# Patient Record
Sex: Male | Born: 1965 | Race: Black or African American | Hispanic: No | Marital: Married | Smoking: Current every day smoker
Health system: Southern US, Community
[De-identification: ages and names within clinical notes are randomized; demographics above are authoritative.]

## PROBLEM LIST (undated history)

## (undated) DIAGNOSIS — G510 Bell's palsy: Secondary | ICD-10-CM

## (undated) DIAGNOSIS — B019 Varicella without complication: Secondary | ICD-10-CM

## (undated) DIAGNOSIS — B009 Herpesviral infection, unspecified: Secondary | ICD-10-CM

## (undated) DIAGNOSIS — I10 Essential (primary) hypertension: Secondary | ICD-10-CM

## (undated) DIAGNOSIS — T7840XA Allergy, unspecified, initial encounter: Secondary | ICD-10-CM

## (undated) DIAGNOSIS — E559 Vitamin D deficiency, unspecified: Secondary | ICD-10-CM

## (undated) HISTORY — PX: DENTAL RESTORATION/EXTRACTION WITH X-RAY: SHX5796

## (undated) HISTORY — DX: Vitamin D deficiency, unspecified: E55.9

## (undated) HISTORY — DX: Varicella without complication: B01.9

## (undated) HISTORY — DX: Allergy, unspecified, initial encounter: T78.40XA

## (undated) HISTORY — PX: COLONOSCOPY: SHX174

## (undated) HISTORY — DX: Herpesviral infection, unspecified: B00.9

## (undated) HISTORY — PX: HERNIA REPAIR: SHX51

---

## 2007-01-26 ENCOUNTER — Emergency Department: Payer: Self-pay | Admitting: Emergency Medicine

## 2010-01-25 ENCOUNTER — Emergency Department (HOSPITAL_COMMUNITY): Admission: EM | Admit: 2010-01-25 | Discharge: 2010-01-25 | Payer: Self-pay | Admitting: Emergency Medicine

## 2011-02-23 ENCOUNTER — Emergency Department: Payer: Self-pay | Admitting: Emergency Medicine

## 2011-03-03 ENCOUNTER — Emergency Department: Payer: Self-pay | Admitting: *Deleted

## 2013-01-15 ENCOUNTER — Emergency Department: Payer: Self-pay | Admitting: Emergency Medicine

## 2014-04-28 ENCOUNTER — Emergency Department: Payer: Self-pay | Admitting: Internal Medicine

## 2016-01-10 ENCOUNTER — Encounter (HOSPITAL_BASED_OUTPATIENT_CLINIC_OR_DEPARTMENT_OTHER): Payer: Self-pay | Admitting: *Deleted

## 2016-01-10 ENCOUNTER — Emergency Department (HOSPITAL_BASED_OUTPATIENT_CLINIC_OR_DEPARTMENT_OTHER)
Admission: EM | Admit: 2016-01-10 | Discharge: 2016-01-10 | Disposition: A | Discharge status: Home / Self Care | Payer: BLUE CROSS/BLUE SHIELD | Attending: Emergency Medicine | Admitting: Emergency Medicine

## 2016-01-10 ENCOUNTER — Emergency Department (HOSPITAL_BASED_OUTPATIENT_CLINIC_OR_DEPARTMENT_OTHER): Payer: BLUE CROSS/BLUE SHIELD

## 2016-01-10 DIAGNOSIS — Y939 Activity, unspecified: Secondary | ICD-10-CM | POA: Diagnosis not present

## 2016-01-10 DIAGNOSIS — Y999 Unspecified external cause status: Secondary | ICD-10-CM | POA: Diagnosis not present

## 2016-01-10 DIAGNOSIS — S60562A Insect bite (nonvenomous) of left hand, initial encounter: Secondary | ICD-10-CM | POA: Diagnosis present

## 2016-01-10 DIAGNOSIS — F17210 Nicotine dependence, cigarettes, uncomplicated: Secondary | ICD-10-CM | POA: Diagnosis not present

## 2016-01-10 DIAGNOSIS — L02512 Cutaneous abscess of left hand: Secondary | ICD-10-CM | POA: Diagnosis not present

## 2016-01-10 DIAGNOSIS — W57XXXA Bitten or stung by nonvenomous insect and other nonvenomous arthropods, initial encounter: Secondary | ICD-10-CM | POA: Insufficient documentation

## 2016-01-10 DIAGNOSIS — L03114 Cellulitis of left upper limb: Secondary | ICD-10-CM | POA: Diagnosis not present

## 2016-01-10 DIAGNOSIS — Y929 Unspecified place or not applicable: Secondary | ICD-10-CM | POA: Diagnosis not present

## 2016-01-10 DIAGNOSIS — L02519 Cutaneous abscess of unspecified hand: Secondary | ICD-10-CM

## 2016-01-10 DIAGNOSIS — Z792 Long term (current) use of antibiotics: Secondary | ICD-10-CM | POA: Diagnosis not present

## 2016-01-10 LAB — COMPREHENSIVE METABOLIC PANEL
ALK PHOS: 94 U/L (ref 38–126)
ALT: 13 U/L — AB (ref 17–63)
AST: 17 U/L (ref 15–41)
Albumin: 3.9 g/dL (ref 3.5–5.0)
Anion gap: 9 (ref 5–15)
BUN: 13 mg/dL (ref 6–20)
CALCIUM: 9.8 mg/dL (ref 8.9–10.3)
CO2: 26 mmol/L (ref 22–32)
CREATININE: 1.16 mg/dL (ref 0.61–1.24)
Chloride: 104 mmol/L (ref 101–111)
Glucose, Bld: 85 mg/dL (ref 65–99)
Potassium: 3.6 mmol/L (ref 3.5–5.1)
SODIUM: 139 mmol/L (ref 135–145)
Total Bilirubin: 0.6 mg/dL (ref 0.3–1.2)
Total Protein: 8.1 g/dL (ref 6.5–8.1)

## 2016-01-10 LAB — CBC WITH DIFFERENTIAL/PLATELET
BASOS PCT: 0 %
Basophils Absolute: 0.1 10*3/uL (ref 0.0–0.1)
EOS ABS: 0.6 10*3/uL (ref 0.0–0.7)
EOS PCT: 4 %
HCT: 42.6 % (ref 39.0–52.0)
HEMOGLOBIN: 14.7 g/dL (ref 13.0–17.0)
Lymphocytes Relative: 21 %
Lymphs Abs: 2.7 10*3/uL (ref 0.7–4.0)
MCH: 31.5 pg (ref 26.0–34.0)
MCHC: 34.5 g/dL (ref 30.0–36.0)
MCV: 91.4 fL (ref 78.0–100.0)
MONOS PCT: 10 %
Monocytes Absolute: 1.3 10*3/uL — ABNORMAL HIGH (ref 0.1–1.0)
NEUTROS PCT: 65 %
Neutro Abs: 8.7 10*3/uL — ABNORMAL HIGH (ref 1.7–7.7)
Platelets: 234 10*3/uL (ref 150–400)
RBC: 4.66 MIL/uL (ref 4.22–5.81)
RDW: 13.1 % (ref 11.5–15.5)
WBC: 13.4 10*3/uL — ABNORMAL HIGH (ref 4.0–10.5)

## 2016-01-10 LAB — I-STAT CG4 LACTIC ACID, ED: Lactic Acid, Venous: 1.2 mmol/L (ref 0.5–1.9)

## 2016-01-10 MED ORDER — VANCOMYCIN HCL IN DEXTROSE 1-5 GM/200ML-% IV SOLN
1000.0000 mg | Freq: Once | INTRAVENOUS | Status: AC
  Administered 2016-01-10: 1000 mg via INTRAVENOUS
  Filled 2016-01-10: qty 200

## 2016-01-10 MED ORDER — DIPHENHYDRAMINE HCL 50 MG/ML IJ SOLN
25.0000 mg | Freq: Once | INTRAMUSCULAR | Status: AC
  Administered 2016-01-10: 25 mg via INTRAVENOUS
  Filled 2016-01-10: qty 1

## 2016-01-10 MED ORDER — DEXTROSE 5 % IV SOLN
1.0000 g | Freq: Once | INTRAVENOUS | Status: AC
  Administered 2016-01-10: 1 g via INTRAVENOUS
  Filled 2016-01-10: qty 10

## 2016-01-10 MED ORDER — LIDOCAINE HCL (PF) 1 % IJ SOLN
5.0000 mL | Freq: Once | INTRAMUSCULAR | Status: AC
  Administered 2016-01-10: 5 mL
  Filled 2016-01-10: qty 5

## 2016-01-10 MED ORDER — OXYCODONE-ACETAMINOPHEN 5-325 MG PO TABS
2.0000 | ORAL_TABLET | Freq: Once | ORAL | Status: AC
  Administered 2016-01-10: 2 via ORAL
  Filled 2016-01-10: qty 2

## 2016-01-10 NOTE — ED Provider Notes (Signed)
CSN: [id number]     Arrival date & time 01/10/16  1758 History  By signing my name below, I, Soijett Blue, attest that this documentation has been prepared under the direction and in the presence of Charlesetta Shanks, MD. Electronically Signed: [place], ED Scribe. 01/10/2016. 7:03 PM.   Chief Complaint  Patient presents with  . Insect Bite      The history is provided by the patient. No language interpreter was used.    Walter Huffman is a 50 y.o. male who presents to the Emergency Department complaining of an insect bite onset 6 days ago. Pt notes that he was bit by a spider to his left hand with two small holes to the area initially. Pt states that he was seen at Urgent Care for his symptoms 4 days ago and was Rx bactrim and informed to apply warm compresses to the area. Pt notes that after applying warm compresses, the wound opened and began to drain. Pt states that his left hand/wrist pain resolved following. Pt is having associated symptoms of fever of 101 last night, resolved chills x 2 days ago, and left hand swelling. Pt states that his left hand swelling has mildly improved since being treated with antibiotics. Pt reports that he has tried Rx septra and warm compresses for the relief of his symptoms. Pt denies left wrist pain, left hand pain, and any other symptoms. Denies allergies to medications.    Past Medical History  Diagnosis Date  . Bell's palsy    Past Surgical History  Procedure Laterality Date  . Colonoscopy    . Hernia repair    . Dental restoration/extraction with x-ray      had teeth removed  . I&d extremity Left 01/11/2016    Procedure: IRRIGATION AND DEBRIDEMENT OF WRIST;  Surgeon: Iran Planas, MD;  Location: [facility];  Service: Orthopedics;  Laterality: Left;   History reviewed. No pertinent family history. Social History  Substance Use Topics  . Smoking status: Current Every Day Smoker -- 1.00 packs/day for 20 years    Types: Cigarettes  . Smokeless  tobacco: None  . Alcohol Use: No    Review of Systems  Constitutional: Positive for fever and chills.  Musculoskeletal: Positive for joint swelling (left hand). Negative for arthralgias.  10 Systems reviewed and are negative for acute change except as noted in the HPI.    Allergies  Review of patient's allergies indicates no known allergies.  Home Medications   Prior to Admission medications   Medication Sig Start Date End Date Taking? Authorizing Provider  sulfamethoxazole-trimethoprim (BACTRIM DS,SEPTRA DS) 800-160 MG tablet Take 1 tablet by mouth 2 (two) times daily.   Yes Historical Provider, MD  docusate sodium (COLACE) 100 MG capsule Take 1 capsule (100 mg total) by mouth 2 (two) times daily. 01/11/16   Iran Planas, MD  doxycycline (VIBRA-TABS) 100 MG tablet Take 1 tablet (100 mg total) by mouth 2 (two) times daily. 01/11/16   Iran Planas, MD  naproxen sodium (ANAPROX) 220 MG tablet Take 660 mg by mouth daily as needed (general pain).    Historical Provider, MD  oxyCODONE-acetaminophen (ROXICET) 5-325 MG tablet Take 1 tablet by mouth every 4 (four) hours as needed for severe pain. 01/11/16   Iran Planas, MD   BP 124/90 mmHg  Pulse 54  Temp(Src) 98.3 F (36.8 C) (Oral)  Resp 18  Ht 5\' 8"  (1.727 m)  Wt 135 lb (61.236 kg)  BMI 20.53 kg/m2  SpO2 98% Physical  Exam  Constitutional: He is oriented to person, place, and time. He appears well-developed and well-nourished. No distress.  HENT:  Head: Normocephalic and atraumatic.  Eyes: EOM are normal.  Neck: Neck supple.  Cardiovascular: Normal rate, regular rhythm and normal heart sounds.  Exam reveals no gallop and no friction rub.   No murmur heard. Pulmonary/Chest: Effort normal and breath sounds normal. No respiratory distress. He has no wheezes. He has no rales.  Abdominal: Soft. He exhibits no distension. There is no tenderness.  Musculoskeletal: Normal range of motion. He exhibits edema and tenderness.  No LE  edema. Patient left hand has moderate diffuse swelling over the dorsum of the hand that extends halfway up the forearm. This area is erythematous. Localized on the ulnar aspect of the wrist there is an approximately 3 cm raised abscess area with a moist eschar on top. This area is more erythematous and swollen. Patient is able to flex and extend at the wrist although with some tenderness. She can open and close the hand with range of motion intact for all fingers. No pain or swelling to the level of the elbow.  Neurological: He is alert and oriented to person, place, and time.  Skin: Skin is warm and dry.  Psychiatric: He has a normal mood and affect. His behavior is normal.  Nursing note and vitals reviewed.   ED Course  Procedures (including critical care time) DIAGNOSTIC STUDIES: Oxygen Saturation is 100% on RA, nl by my interpretation.    COORDINATION OF CARE: 7:01 PM Discussed treatment plan with pt at bedside which includes IV abx, labs, I&D, and follow up with orthopedist, and pt agreed to plan.  INCISION AND DRAINAGE PROCEDURE NOTE: Patient identification was confirmed and verbal consent was obtained. This procedure was performed by Charlesetta Shanks, MD at 10:22 PM. Site: left wrist Sterile procedures observed: Yes Needle size: 23 gauge  Anesthetic used (type and amt): 1% lidocaine without epinephrine and 2 ml used Blade size: 11 Drainage: scant purulent Complexity: Complex Packing used: 1/2 inch iodoform Site anesthetized, incision made over site, wound drained and explored loculations, rinsed with copious amounts of normal saline, wound packed with sterile gauze, covered with dry, sterile dressing.  Pt tolerated procedure well without complications.  Instructions for care discussed verbally and pt provided with additional written instructions for homecare and f/u.   Labs Review Labs Reviewed  COMPREHENSIVE METABOLIC PANEL - Abnormal; Notable for the following:    ALT 13  (*)    All other components within normal limits  CBC WITH DIFFERENTIAL/PLATELET - Abnormal; Notable for the following:    WBC 13.4 (*)    Neutro Abs 8.7 (*)    Monocytes Absolute 1.3 (*)    All other components within normal limits  CULTURE, BLOOD (ROUTINE X 2)  CULTURE, BLOOD (ROUTINE X 2)  AEROBIC CULTURE (SUPERFICIAL SPECIMEN)  I-STAT CG4 LACTIC ACID, ED    Imaging Review Dg Hand Complete Left  01/10/2016  CLINICAL DATA:  Status post spider bite of the left hand 5 days ago with infected wound at the base of the fifth metacarpal EXAM: LEFT HAND - COMPLETE 3+ VIEW COMPARISON:  None. FINDINGS: There is no evidence of fracture or dislocation. There is no evidence of arthropathy or other focal bone abnormality. There is soft tissue swelling near the base of the fifth metacarpal. There is no osteomyelitis. IMPRESSION: No bony abnormality identified. Soft tissue swelling near the base of fifth metacarpal. Electronically Signed   By: Abelardo Diesel  M.D.   On: 01/10/2016 20:44    I have personally reviewed and evaluated these images and lab results as part of my medical decision-making.  Consult: Case reviewed with Dr. Apolonio Schneiders. At this time, he suggests I can proceed with a larger incision to the abscess and administration of IV antibiotics. Patient will be able to be seen in his office the next morning to be evaluated for either ongoing management with antibiotics or surgical intervention if needed.  MDM   Final diagnoses:  Hand abscess  Cellulitis of left upper extremity   Patient presents with a large abscess to the lateral left wrist. He suspects this was initiated by a spider bite. He had already developed some spontaneous drainage. Wider incision and drainage with packing is performed. Patient was treated with Rocephin and vancomycin in the emergency department. He is alert and nontoxic. No elevation and lactic acid. No fever. Vital signs stable. At this time, I feel he is safe to go home  and follow-up with Dr. Apolonio Schneiders. This will be less than 24 hours from his antibiotic administration. Signs and symptoms for which return are reviewed.   Charlesetta Shanks, MD 01/13/16 1039

## 2016-01-10 NOTE — Discharge Instructions (Signed)
Abscess You have had Rocephin and vancomycin in the emergency department tonight. Your abscess has been opened and packed. You are to follow-up with the hand surgeon, Dr. Apolonio Schneiders tomorrow morning in his office at 8 AM. Do not eat or drink anything after midnight tonight. You may have surgery tomorrow if Dr. Apolonio Schneiders finds that you need a larger incision and drainage of your abscess. An abscess is an infected area that contains a collection of pus and debris.It can occur in almost any part of the body. An abscess is also known as a furuncle or boil. CAUSES  An abscess occurs when tissue gets infected. This can occur from blockage of oil or sweat glands, infection of hair follicles, or a minor injury to the skin. As the body tries to fight the infection, pus collects in the area and creates pressure under the skin. This pressure causes pain. People with weakened immune systems have difficulty fighting infections and get certain abscesses more often.  SYMPTOMS Usually an abscess develops on the skin and becomes a painful mass that is red, warm, and tender. If the abscess forms under the skin, you may feel a moveable soft area under the skin. Some abscesses break open (rupture) on their own, but most will continue to get worse without care. The infection can spread deeper into the body and eventually into the bloodstream, causing you to feel ill.  DIAGNOSIS  Your caregiver will take your medical history and perform a physical exam. A sample of fluid may also be taken from the abscess to determine what is causing your infection. TREATMENT  Your caregiver may prescribe antibiotic medicines to fight the infection. However, taking antibiotics alone usually does not cure an abscess. Your caregiver may need to make a small cut (incision) in the abscess to drain the pus. In some cases, gauze is packed into the abscess to reduce pain and to continue draining the area. HOME CARE INSTRUCTIONS   Only take  over-the-counter or prescription medicines for pain, discomfort, or fever as directed by your caregiver.  If you were prescribed antibiotics, take them as directed. Finish them even if you start to feel better.  If gauze is used, follow your caregiver's directions for changing the gauze.  To avoid spreading the infection:  Keep your draining abscess covered with a bandage.  Wash your hands well.  Do not share personal care items, towels, or whirlpools with others.  Avoid skin contact with others.  Keep your skin and clothes clean around the abscess.  Keep all follow-up appointments as directed by your caregiver. SEEK MEDICAL CARE IF:   You have increased pain, swelling, redness, fluid drainage, or bleeding.  You have muscle aches, chills, or a general ill feeling.  You have a fever. MAKE SURE YOU:   Understand these instructions.  Will watch your condition.  Will get help right away if you are not doing well or get worse.   This information is not intended to replace advice given to you by your health care provider. Make sure you discuss any questions you have with your health care provider.   Document Released: 04/10/2005 Document Revised: 12/31/2011 Document Reviewed: 09/13/2011 Elsevier Interactive Patient Education [employer].

## 2016-01-10 NOTE — ED Notes (Signed)
Pt c/o insect bite to left hand x 6 days ago , seen by UC x 4 days ago , taking septra, w/o improvement

## 2016-01-11 ENCOUNTER — Encounter (HOSPITAL_COMMUNITY): Payer: Self-pay | Admitting: *Deleted

## 2016-01-11 ENCOUNTER — Ambulatory Visit (HOSPITAL_COMMUNITY): Payer: BLUE CROSS/BLUE SHIELD | Admitting: Anesthesiology

## 2016-01-11 ENCOUNTER — Ambulatory Visit (HOSPITAL_COMMUNITY)
Admission: AD | Admit: 2016-01-11 | Discharge: 2016-01-11 | Disposition: A | Discharge status: Home / Self Care | Payer: BLUE CROSS/BLUE SHIELD | Source: Ambulatory Visit | Attending: Orthopedic Surgery | Admitting: Orthopedic Surgery

## 2016-01-11 ENCOUNTER — Encounter (HOSPITAL_COMMUNITY)
Admission: AD | Disposition: A | Discharge status: Home / Self Care | Payer: Self-pay | Source: Ambulatory Visit | Attending: Orthopedic Surgery

## 2016-01-11 DIAGNOSIS — T63301A Toxic effect of unspecified spider venom, accidental (unintentional), initial encounter: Secondary | ICD-10-CM | POA: Insufficient documentation

## 2016-01-11 DIAGNOSIS — L02519 Cutaneous abscess of unspecified hand: Secondary | ICD-10-CM | POA: Insufficient documentation

## 2016-01-11 DIAGNOSIS — F17210 Nicotine dependence, cigarettes, uncomplicated: Secondary | ICD-10-CM | POA: Insufficient documentation

## 2016-01-11 HISTORY — DX: Bell's palsy: G51.0

## 2016-01-11 HISTORY — PX: I&D EXTREMITY: SHX5045

## 2016-01-11 SURGERY — IRRIGATION AND DEBRIDEMENT EXTREMITY
Anesthesia: General | Site: Wrist | Laterality: Left

## 2016-01-11 MED ORDER — ONDANSETRON HCL 4 MG/2ML IJ SOLN
INTRAMUSCULAR | Status: DC | PRN
  Administered 2016-01-11: 4 mg via INTRAVENOUS

## 2016-01-11 MED ORDER — PROMETHAZINE HCL 25 MG/ML IJ SOLN
6.2500 mg | INTRAMUSCULAR | Status: DC | PRN
Start: 2016-01-11 — End: 2016-01-11

## 2016-01-11 MED ORDER — PROPOFOL 10 MG/ML IV BOLUS
INTRAVENOUS | Status: DC | PRN
  Administered 2016-01-11: 160 mg via INTRAVENOUS
  Administered 2016-01-11: 20 mg via INTRAVENOUS

## 2016-01-11 MED ORDER — HYDROMORPHONE HCL 1 MG/ML IJ SOLN: 0.2500 mg | INTRAMUSCULAR | Status: DC | PRN

## 2016-01-11 MED ORDER — DOCUSATE SODIUM 100 MG PO CAPS: 100.0000 mg | ORAL_CAPSULE | Freq: Two times a day (BID) | ORAL | Status: DC

## 2016-01-11 MED ORDER — DOXYCYCLINE HYCLATE 100 MG PO TABS: 100.0000 mg | ORAL_TABLET | Freq: Two times a day (BID) | ORAL | Status: DC

## 2016-01-11 MED ORDER — MEPERIDINE HCL 25 MG/ML IJ SOLN: 6.2500 mg | INTRAMUSCULAR | Status: DC | PRN

## 2016-01-11 MED ORDER — FENTANYL CITRATE (PF) 250 MCG/5ML IJ SOLN
INTRAMUSCULAR | Status: AC
  Filled 2016-01-11: qty 5

## 2016-01-11 MED ORDER — MIDAZOLAM HCL 5 MG/5ML IJ SOLN
INTRAMUSCULAR | Status: DC | PRN
  Administered 2016-01-11: 2 mg via INTRAVENOUS

## 2016-01-11 MED ORDER — LIDOCAINE HCL (CARDIAC) 10 MG/ML IV SOLN
INTRAVENOUS | Status: DC | PRN
  Administered 2016-01-11: 100 mg via INTRAVENOUS

## 2016-01-11 MED ORDER — OXYCODONE-ACETAMINOPHEN 5-325 MG PO TABS: 1.0000 | ORAL_TABLET | ORAL | Status: DC | PRN

## 2016-01-11 MED ORDER — MIDAZOLAM HCL 2 MG/2ML IJ SOLN
INTRAMUSCULAR | Status: AC
  Filled 2016-01-11: qty 2

## 2016-01-11 MED ORDER — SODIUM CHLORIDE 0.9 % IR SOLN
Status: DC | PRN
  Administered 2016-01-11: 3000 mL

## 2016-01-11 MED ORDER — LACTATED RINGERS IV SOLN
INTRAVENOUS | Status: DC
  Administered 2016-01-11: 14:00:00 via INTRAVENOUS

## 2016-01-11 MED ORDER — FENTANYL CITRATE (PF) 100 MCG/2ML IJ SOLN
INTRAMUSCULAR | Status: DC | PRN
  Administered 2016-01-11: 25 ug via INTRAVENOUS
  Administered 2016-01-11: 50 ug via INTRAVENOUS
  Administered 2016-01-11: 25 ug via INTRAVENOUS

## 2016-01-11 MED ORDER — BUPIVACAINE HCL (PF) 0.25 % IJ SOLN
INTRAMUSCULAR | Status: AC
  Filled 2016-01-11: qty 30

## 2016-01-11 SURGICAL SUPPLY — 60 items
BANDAGE ACE 3X5.8 VEL STRL LF (GAUZE/BANDAGES/DRESSINGS) ×3 IMPLANT
BANDAGE ELASTIC 3 VELCRO ST LF (GAUZE/BANDAGES/DRESSINGS) ×6 IMPLANT
BANDAGE ELASTIC 4 VELCRO ST LF (GAUZE/BANDAGES/DRESSINGS) ×3 IMPLANT
BNDG COHESIVE 1X5 TAN STRL LF (GAUZE/BANDAGES/DRESSINGS) IMPLANT
BNDG CONFORM 2 STRL LF (GAUZE/BANDAGES/DRESSINGS) IMPLANT
BNDG ESMARK 4X9 LF (GAUZE/BANDAGES/DRESSINGS) ×3 IMPLANT
BNDG GAUZE ELAST 4 BULKY (GAUZE/BANDAGES/DRESSINGS) ×3 IMPLANT
CORDS BIPOLAR (ELECTRODE) ×3 IMPLANT
COVER SURGICAL LIGHT HANDLE (MISCELLANEOUS) ×3 IMPLANT
CUFF TOURNIQUET SINGLE 18IN (TOURNIQUET CUFF) ×3 IMPLANT
CUFF TOURNIQUET SINGLE 24IN (TOURNIQUET CUFF) IMPLANT
DRAIN PENROSE 1/4X12 LTX STRL (WOUND CARE) IMPLANT
DRAPE SURG 17X23 STRL (DRAPES) ×3 IMPLANT
DRSG ADAPTIC 3X8 NADH LF (GAUZE/BANDAGES/DRESSINGS) ×3 IMPLANT
ELECT REM PT RETURN 9FT ADLT (ELECTROSURGICAL)
ELECTRODE REM PT RTRN 9FT ADLT (ELECTROSURGICAL) IMPLANT
GAUZE PACKING IODOFORM 1/4X15 (GAUZE/BANDAGES/DRESSINGS) ×3 IMPLANT
GAUZE SPONGE 4X4 12PLY STRL (GAUZE/BANDAGES/DRESSINGS) ×6 IMPLANT
GAUZE XEROFORM 1X8 LF (GAUZE/BANDAGES/DRESSINGS) ×3 IMPLANT
GAUZE XEROFORM 5X9 LF (GAUZE/BANDAGES/DRESSINGS) IMPLANT
GLOVE BIOGEL PI IND STRL 8.5 (GLOVE) ×1 IMPLANT
GLOVE BIOGEL PI INDICATOR 8.5 (GLOVE) ×2
GLOVE SURG ORTHO 8.0 STRL STRW (GLOVE) ×3 IMPLANT
GOWN STRL REUS W/ TWL LRG LVL3 (GOWN DISPOSABLE) ×3 IMPLANT
GOWN STRL REUS W/ TWL XL LVL3 (GOWN DISPOSABLE) ×1 IMPLANT
GOWN STRL REUS W/TWL LRG LVL3 (GOWN DISPOSABLE) ×6
GOWN STRL REUS W/TWL XL LVL3 (GOWN DISPOSABLE) ×2
HANDPIECE INTERPULSE COAX TIP (DISPOSABLE)
KIT BASIN OR (CUSTOM PROCEDURE TRAY) ×3 IMPLANT
KIT ROOM TURNOVER OR (KITS) ×3 IMPLANT
MANIFOLD NEPTUNE II (INSTRUMENTS) ×3 IMPLANT
NEEDLE HYPO 25GX1X1/2 BEV (NEEDLE) IMPLANT
NS IRRIG 1000ML POUR BTL (IV SOLUTION) ×3 IMPLANT
PACK ORTHO EXTREMITY (CUSTOM PROCEDURE TRAY) ×3 IMPLANT
PAD ARMBOARD 7.5X6 YLW CONV (MISCELLANEOUS) ×6 IMPLANT
PAD CAST 4YDX4 CTTN HI CHSV (CAST SUPPLIES) ×1 IMPLANT
PADDING CAST COTTON 4X4 STRL (CAST SUPPLIES) ×2
SET HNDPC FAN SPRY TIP SCT (DISPOSABLE) IMPLANT
SOAP 2 % CHG 4 OZ (WOUND CARE) ×3 IMPLANT
SPLINT FIBERGLASS 3X12 (CAST SUPPLIES) ×3 IMPLANT
SPONGE GAUZE 4X4 12PLY STER LF (GAUZE/BANDAGES/DRESSINGS) ×3 IMPLANT
SPONGE LAP 18X18 X RAY DECT (DISPOSABLE) ×3 IMPLANT
SPONGE LAP 4X18 X RAY DECT (DISPOSABLE) ×3 IMPLANT
SUCTION FRAZIER HANDLE 10FR (MISCELLANEOUS) ×2
SUCTION TUBE FRAZIER 10FR DISP (MISCELLANEOUS) ×1 IMPLANT
SUT ETHILON 4 0 PS 2 18 (SUTURE) IMPLANT
SUT ETHILON 5 0 P 3 18 (SUTURE)
SUT NYLON ETHILON 5-0 P-3 1X18 (SUTURE) IMPLANT
SUT PROLENE 3 0 PS 2 (SUTURE) ×3 IMPLANT
SWAB COLLECTION DEVICE MRSA (MISCELLANEOUS) ×3 IMPLANT
SWAB CULTURE ESWAB REG 1ML (MISCELLANEOUS) ×3 IMPLANT
SYR CONTROL 10ML LL (SYRINGE) IMPLANT
TOWEL OR 17X24 6PK STRL BLUE (TOWEL DISPOSABLE) ×3 IMPLANT
TOWEL OR 17X26 10 PK STRL BLUE (TOWEL DISPOSABLE) ×3 IMPLANT
TUBE ANAEROBIC SPECIMEN COL (MISCELLANEOUS) IMPLANT
TUBE CONNECTING 12'X1/4 (SUCTIONS) ×1
TUBE CONNECTING 12X1/4 (SUCTIONS) ×2 IMPLANT
UNDERPAD 30X30 INCONTINENT (UNDERPADS AND DIAPERS) ×3 IMPLANT
WATER STERILE IRR 1000ML POUR (IV SOLUTION) ×3 IMPLANT
YANKAUER SUCT BULB TIP NO VENT (SUCTIONS) ×3 IMPLANT

## 2016-01-11 NOTE — Discharge Instructions (Signed)
KEEP BANDAGE CLEAN AND DRY CALL OFFICE FOR F/U APPT [phone] on Monday 01/15/2016 ALSO CALL FOR THERAPY APPT ON 01/15/2016 [id number] EXT 41 KEEP HAND ELEVATED ABOVE HEART OK TO APPLY ICE TO OPERATIVE AREA CONTACT OFFICE IF ANY WORSENING PAIN OR CONCERNS.

## 2016-01-11 NOTE — Transfer of Care (Signed)
Immediate Anesthesia Transfer of Care Note  Patient: Walter Huffman  Procedure(s) Performed: Procedure(s): IRRIGATION AND DEBRIDEMENT OF WRIST (Left)  Patient Location: PACU  Anesthesia Type:General  Level of Consciousness: sedated  Airway & Oxygen Therapy: Patient Spontanous Breathing and Patient connected to nasal cannula oxygen  Post-op Assessment: Report given to RN and Post -op Vital signs reviewed and stable  Post vital signs: Reviewed and stable  Last Vitals:  Filed Vitals:   01/11/16 1341 01/11/16 1632  BP: 163/94 149/98  Pulse: 54 70  Temp: 36.7 C 36.5 C  Resp: 20 11    Last Pain: There were no vitals filed for this visit.    Patients Stated Pain Goal: 2 ([id number])  Complications: No apparent anesthesia complications

## 2016-01-11 NOTE — Anesthesia Postprocedure Evaluation (Signed)
Anesthesia Post Note  Patient: Walter Huffman  Procedure(s) Performed: Procedure(s) (LRB): IRRIGATION AND DEBRIDEMENT OF WRIST (Left)  Patient location during evaluation: PACU Anesthesia Type: General Level of consciousness: awake and alert Pain management: pain level controlled Vital Signs Assessment: post-procedure vital signs reviewed and stable Respiratory status: spontaneous breathing, nonlabored ventilation, respiratory function stable and patient connected to nasal cannula oxygen Cardiovascular status: blood pressure returned to baseline and stable Postop Assessment: no signs of nausea or vomiting Anesthetic complications: no    Last Vitals:  Filed Vitals:   01/11/16 1647 01/11/16 1702  BP: 144/94 128/80  Pulse: 60 58  Temp:    Resp: 16 18    Last Pain: There were no vitals filed for this visit.               [place]

## 2016-01-11 NOTE — H&P (Signed)
Walter Huffman is an 50 y.o. male.   Chief Complaint: left wrist pain and swelling HPI: Pt seen/evaluated in office today Pt with abscess over dorsal ulnar aspect of wrist Pt here for surgery on left wrist No prior surgery to left wrist Pt not sure if bitten by anything,reason for infection  No past medical history on file.  No past surgical history on file.  No family history on file. Social History:  reports that he has been smoking Cigarettes.  He has been smoking about 1.00 pack per day. He does not have any smokeless tobacco history on file. He reports that he does not drink alcohol or use illicit drugs.  Allergies: No Known Allergies  No prescriptions prior to admission    Results for orders placed or performed during the hospital encounter of 01/10/16 (from the past 48 hour(s))  Comprehensive metabolic panel     Status: Abnormal   Collection Time: 01/10/16  7:30 PM  Result Value Ref Range   Sodium 139 135 - 145 mmol/L   Potassium 3.6 3.5 - 5.1 mmol/L   Chloride 104 101 - 111 mmol/L   CO2 26 22 - 32 mmol/L   Glucose, Bld 85 65 - 99 mg/dL   BUN 13 6 - 20 mg/dL   Creatinine, Ser 1.16 0.61 - 1.24 mg/dL   Calcium 9.8 8.9 - 10.3 mg/dL   Total Protein 8.1 6.5 - 8.1 g/dL   Albumin 3.9 3.5 - 5.0 g/dL   AST 17 15 - 41 U/L   ALT 13 (L) 17 - 63 U/L   Alkaline Phosphatase 94 38 - 126 U/L   Total Bilirubin 0.6 0.3 - 1.2 mg/dL   GFR calc non Af Amer >60 >60 mL/min   GFR calc Af Amer >60 >60 mL/min    Comment: (NOTE) The eGFR has been calculated using the CKD EPI equation. This calculation has not been validated in all clinical situations. eGFR's persistently <60 mL/min signify possible Chronic Kidney Disease.    Anion gap 9 5 - 15  CBC with Differential     Status: Abnormal   Collection Time: 01/10/16  7:30 PM  Result Value Ref Range   WBC 13.4 (H) 4.0 - 10.5 K/uL   RBC 4.66 4.22 - 5.81 MIL/uL   Hemoglobin 14.7 13.0 - 17.0 g/dL   HCT 42.6 39.0 - 52.0 %   MCV 91.4 78.0 -  100.0 fL   MCH 31.5 26.0 - 34.0 pg   MCHC 34.5 30.0 - 36.0 g/dL   RDW 13.1 11.5 - 15.5 %   Platelets 234 150 - 400 K/uL   Neutrophils Relative % 65 %   Neutro Abs 8.7 (H) 1.7 - 7.7 K/uL   Lymphocytes Relative 21 %   Lymphs Abs 2.7 0.7 - 4.0 K/uL   Monocytes Relative 10 %   Monocytes Absolute 1.3 (H) 0.1 - 1.0 K/uL   Eosinophils Relative 4 %   Eosinophils Absolute 0.6 0.0 - 0.7 K/uL   Basophils Relative 0 %   Basophils Absolute 0.1 0.0 - 0.1 K/uL  Culture, blood (routine x 2)     Status: None (Preliminary result)   Collection Time: 01/10/16  7:30 PM  Result Value Ref Range   Specimen Description BLOOD RIGHT ARM    Special Requests BOTTLES DRAWN AEROBIC AND ANAEROBIC 5CC    Culture      NO GROWTH < 12 HOURS Performed at [facility]    Report Status PENDING   Culture, blood (routine  x 2)     Status: None (Preliminary result)   Collection Time: 01/10/16  7:30 PM  Result Value Ref Range   Specimen Description BLOOD RIGHT ARM    Special Requests BOTTLES DRAWN AEROBIC AND ANAEROBIC 5CC    Culture      NO GROWTH < 12 HOURS Performed at [facility]    Report Status PENDING   Wound or Superficial Culture     Status: None (Preliminary result)   Collection Time: 01/10/16  7:30 PM  Result Value Ref Range   Specimen Description ABSCESS LEFT HAND    Special Requests NONE    Gram Stain      MODERATE WBC PRESENT,BOTH PMN AND MONONUCLEAR FEW GRAM POSITIVE COCCI IN PAIRS RARE GRAM VARIABLE ROD Performed at [facility]    Culture PENDING    Report Status PENDING   I-Stat CG4 Lactic Acid, ED     Status: None   Collection Time: 01/10/16  7:36 PM  Result Value Ref Range   Lactic Acid, Venous 1.20 0.5 - 1.9 mmol/L   Dg Hand Complete Left  01/10/2016  CLINICAL DATA:  Status post spider bite of the left hand 5 days ago with infected wound at the base of the fifth metacarpal EXAM: LEFT HAND - COMPLETE 3+ VIEW COMPARISON:  None. FINDINGS: There is no evidence of  fracture or dislocation. There is no evidence of arthropathy or other focal bone abnormality. There is soft tissue swelling near the base of the fifth metacarpal. There is no osteomyelitis. IMPRESSION: No bony abnormality identified. Soft tissue swelling near the base of fifth metacarpal. Electronically Signed   By: Abelardo Diesel M.D.   On: 01/10/2016 20:44    ROSNO RECENT ILLNESSES OR HOSPITALIZATIONS  There were no vitals taken for this visit. Physical Exam  General Appearance:  Alert, cooperative, no distress, appears stated age  Head:  Normocephalic, without obvious abnormality, atraumatic  Eyes:  Pupils equal, conjunctiva/corneas clear,         Throat: Lips, mucosa, and tongue normal; teeth and gums normal  Neck: No visible masses     Lungs:   respirations unlabored  Chest Wall:  No tenderness or deformity  Heart:  Regular rate and rhythm,  Abdomen:   Soft, non-tender,         Extremities: LEFT WRIST: DORSAL ULNAR ABSCESS WITH FLUCTUANT AREA EXTENDING TO DORSUM OF HAND FINGERS WARM WELL PERFUSED LIMITED WRIST AND FOREARM ROTATION NVI LEFT HAND  Pulses: 2+ and symmetric  Skin: Skin color, texture, turgor normal, no rashes or lesions     Neurologic: Normal    Assessment/Plan LEFT WRIST DORSAL ULNAR ABSCESS  LEFT WRIST INCISION AND DRAINAGE AND DEBRIDEMENT AS INDICATED  R/B/A DISCUSSED WITH PT IN OFFICE.  PT VOICED UNDERSTANDING OF PLAN CONSENT SIGNED DAY OF SURGERY PT SEEN AND EXAMINED PRIOR TO OPERATIVE PROCEDURE/DAY OF SURGERY SITE MARKED. QUESTIONS ANSWERED WILL GO HOME FOLLOWING SURGERY  WE ARE PLANNING SURGERY FOR YOUR UPPER EXTREMITY. THE RISKS AND BENEFITS OF SURGERY INCLUDE BUT NOT LIMITED TO BLEEDING INFECTION, DAMAGE TO NEARBY NERVES ARTERIES TENDONS, FAILURE OF SURGERY TO ACCOMPLISH ITS INTENDED GOALS, PERSISTENT SYMPTOMS AND NEED FOR FURTHER SURGICAL INTERVENTION. WITH THIS IN MIND WE WILL PROCEED. I HAVE DISCUSSED WITH THE PATIENT THE PRE AND POSTOPERATIVE  REGIMEN AND THE DOS AND DON'TS. PT VOICED UNDERSTANDING AND INFORMED CONSENT SIGNED.  [name] 01/11/2016, 1:02 PM

## 2016-01-11 NOTE — Anesthesia Procedure Notes (Signed)
Procedure Name: LMA Insertion Date/Time: 01/11/2016 3:53 PM Performed by: Merrilyn Puma B Pre-anesthesia Checklist: Patient identified, Emergency Drugs available, Suction available, Patient being monitored and Timeout performed Patient Re-evaluated:Patient Re-evaluated prior to inductionOxygen Delivery Method: Circle system utilized Preoxygenation: Pre-oxygenation with 100% oxygen Intubation Type: IV induction LMA: LMA inserted LMA Size: 4.0 Number of attempts: 1 Placement Confirmation: breath sounds checked- equal and bilateral,  CO2 detector and positive ETCO2 Tube secured with: Tape Dental Injury: Teeth and Oropharynx as per pre-operative assessment

## 2016-01-11 NOTE — Brief Op Note (Signed)
01/11/2016  1:05 PM  PATIENT:  Walter Huffman  50 y.o. male  PRE-OPERATIVE DIAGNOSIS:  hand abscess  POST-OPERATIVE DIAGNOSIS:  * No post-op diagnosis entered *  PROCEDURE:  Procedure(s): IRRIGATION AND DEBRIDEMENT OF WRIST (Left)  SURGEON:  Surgeon(s) and Role:    * Iran Planas, MD - Primary  PHYSICIAN ASSISTANT:   ASSISTANTS: none   ANESTHESIA:   general  EBL:     BLOOD ADMINISTERED:none  DRAINS: none   LOCAL MEDICATIONS USED:  NONE  SPECIMEN:  No Specimen  DISPOSITION OF SPECIMEN:  N/A  COUNTS:  YES  TOURNIQUET:  * No tourniquets in log *  DICTATION: .[id number]  PLAN OF CARE: Discharge to home after PACU  PATIENT DISPOSITION:  PACU - hemodynamically stable.   Delay start of Pharmacological VTE agent (>24hrs) due to surgical blood loss or risk of bleeding: not applicable

## 2016-01-11 NOTE — Anesthesia Preprocedure Evaluation (Signed)
Anesthesia Evaluation  Patient identified by MRN, date of birth, ID band Patient awake    Reviewed: Allergy & Precautions, NPO status , Patient's Chart, lab work & pertinent test results  Airway Mallampati: II  TM Distance: >3 FB Neck ROM: Full    Dental no notable dental hx.    Pulmonary neg pulmonary ROS, Current Smoker,    Pulmonary exam normal breath sounds clear to auscultation       Cardiovascular negative cardio ROS Normal cardiovascular exam Rhythm:Regular Rate:Normal     Neuro/Psych negative neurological ROS  negative psych ROS   GI/Hepatic negative GI ROS, Neg liver ROS,   Endo/Other  negative endocrine ROS  Renal/GU negative Renal ROS     Musculoskeletal negative musculoskeletal ROS (+)   Abdominal   Peds  Hematology negative hematology ROS (+)   Anesthesia Other Findings   Reproductive/Obstetrics                             Anesthesia Physical Anesthesia Plan  ASA: II  Anesthesia Plan: General   Post-op Pain Management:    Induction: Intravenous  Airway Management Planned: LMA  Additional Equipment:   Intra-op Plan:   Post-operative Plan: Extubation in OR  Informed Consent: I have reviewed the patients History and Physical, chart, labs and discussed the procedure including the risks, benefits and alternatives for the proposed anesthesia with the patient or authorized representative who has indicated his/her understanding and acceptance.   Dental advisory given  Plan Discussed with: CRNA  Anesthesia Plan Comments:         Anesthesia Quick Evaluation

## 2016-01-12 ENCOUNTER — Encounter (HOSPITAL_COMMUNITY): Payer: Self-pay | Admitting: Orthopedic Surgery

## 2016-01-12 NOTE — Op Note (Signed)
Walter Huffman, Walter Huffman                 ACCOUNT NO.:  [id number]  MEDICAL RECORD NO.:  [id number]  LOCATION:  [facility]                         FACILITY:  [facility]  PHYSICIAN:  [name] IV, M.D.DATE OF BIRTH:  Mar 23, 1966  DATE OF PROCEDURE: DATE OF DISCHARGE:  01/11/2016                              OPERATIVE REPORT   PREOP DIAGNOSIS:  Left wrist dorsal abscess.  POSTOP DIAGNOSIS:  Left wrist dorsal abscess.  ATTENDING PHYSICIAN:  [name], M.D.  scrubbed for the entire procedure.  ASSISTANT SURGEON:  None.  ANESTHESIA:  General via LMA.  SURGICAL PROCEDURE:  Drainage of left wrist deep abscess left wrist region.  SURGICAL INDICATIONS:  Walter Huffman is a right-hand-dominant gentleman with a worsening infection over the dorsal aspect of the wrist.  Patient is seen and evaluated in the office, recommended to undergo the above procedure.  Risks, benefits, and alternatives were discussed in detail with the patient.  Signed informed consent was obtained.  Risks include, but not limited to bleeding, infection, damage to nearby nerves, arteries, or tendons; loss of motion wrist and digits, incomplete relief of symptoms, need for further surgical intervention.  DESCRIPTION OF PROCEDURE:  The patient was properly identified in the preoperative holding area, marked with a permanent marker made on left wrist to indicate the correct operative site.  The patient was then brought back to the operating room, placed supine on anesthesia table. General anesthesia was administered.  The patient tolerated this well. A well-padded tourniquet placed on left brachium, sealed with 1000 drape.  Left upper extremity was then prepped and draped in normal sterile fashion.  Time-out was called.  Correct site was identified, and procedure then begun.  Attention was then turned left wrist.  A longitudinal incision made directly on the dorsal ulnar aspect of the wrist.  This was directly over the  interval between the ECU and FCU. Dissection through it and the limb was then elevated.  Dissection was carried down through the skin and subcutaneous tissue, the abscess area was then opened up all the way down to the level of the region of the ECU.  Deep dissection carried down and an excisional debridement of skin and subcutaneous tissue and devitalized tissue was then carried out sharply with curettes, rongeur and knife.  The wound was then thoroughly irrigated.  Copious wound irrigation done of the abscess area.  Careful dissection of the dorsal ulnar sensory nerve branches done throughout. This was visible and carefully dissected free.  The wound was then irrigated.  Thorough wound irrigation done.  Reapproximation of the wound was then carried out.  The wound was then packed with serial packing gauze.  The patient then placed in a well-padded Adaptic dressing and sterile compressive bandage then applied.  The patient placed in a well-padded volar splint.  Extubated and taken recovery room in good condition.  PLAN:  The patient discharged home to be seen back in office in approximately 2-3 days for wound check.  We will take the packing out, change the wound down the therapy for local and wound care.  Plan to see the patient back next Friday and he has therapy Monday Wednesday and  Friday for local wound care and I will plan to see him back for wound check on Friday.  Continue with the oral antibiotics.  We will try to make sure that we follow up on the culture results. Intraoperative aerobic and anaerobic cultures taken today.     Melrose Nakayama, M.D.     FWO/MEDQ  D:  01/11/2016  T:  01/12/2016  Job:  [id number]

## 2016-01-13 LAB — AEROBIC CULTURE W GRAM STAIN (SUPERFICIAL SPECIMEN)

## 2016-01-13 LAB — AEROBIC CULTURE  (SUPERFICIAL SPECIMEN)

## 2016-01-14 ENCOUNTER — Telehealth (HOSPITAL_BASED_OUTPATIENT_CLINIC_OR_DEPARTMENT_OTHER): Payer: Self-pay

## 2016-01-14 NOTE — Telephone Encounter (Signed)
Post ED Visit - Positive Culture Follow-up  Culture report reviewed by antimicrobial stewardship pharmacist:  []  Elenor Quinones, Pharm.D. []  Heide Guile, Pharm.D., BCPS []  Parks Neptune, Pharm.D. []  Alycia Rossetti, Pharm.D., BCPS []  Becenti, Pharm.D., BCPS, AAHIVP []  Legrand Como, Pharm.D., BCPS, AAHIVP [x]  Milus Glazier, Pharm.D. []  Stephens November, Pharm.D.  Positive urine culture Treated with Sulfamethoxazole-Trimethoprim, organism sensitive to the same and no further patient follow-up is required at this time.  Genia Del 01/14/2016, 9:35 AM

## 2016-01-15 LAB — CULTURE, BLOOD (ROUTINE X 2)
CULTURE: NO GROWTH
CULTURE: NO GROWTH

## 2016-01-16 LAB — AEROBIC/ANAEROBIC CULTURE W GRAM STAIN (SURGICAL/DEEP WOUND)

## 2016-01-16 LAB — AEROBIC/ANAEROBIC CULTURE (SURGICAL/DEEP WOUND)

## 2016-10-21 DIAGNOSIS — R350 Frequency of micturition: Secondary | ICD-10-CM | POA: Diagnosis not present

## 2016-10-21 DIAGNOSIS — R369 Urethral discharge, unspecified: Secondary | ICD-10-CM | POA: Diagnosis not present

## 2017-02-10 ENCOUNTER — Emergency Department: Payer: BLUE CROSS/BLUE SHIELD

## 2017-02-10 ENCOUNTER — Encounter: Payer: Self-pay | Admitting: *Deleted

## 2017-02-10 DIAGNOSIS — Z79899 Other long term (current) drug therapy: Secondary | ICD-10-CM | POA: Diagnosis not present

## 2017-02-10 DIAGNOSIS — R079 Chest pain, unspecified: Secondary | ICD-10-CM | POA: Insufficient documentation

## 2017-02-10 DIAGNOSIS — F17210 Nicotine dependence, cigarettes, uncomplicated: Secondary | ICD-10-CM | POA: Insufficient documentation

## 2017-02-10 LAB — CBC
HEMATOCRIT: 42.7 % (ref 40.0–52.0)
Hemoglobin: 14.5 g/dL (ref 13.0–18.0)
MCH: 32.1 pg (ref 26.0–34.0)
MCHC: 34 g/dL (ref 32.0–36.0)
MCV: 94.2 fL (ref 80.0–100.0)
Platelets: 229 10*3/uL (ref 150–440)
RBC: 4.53 MIL/uL (ref 4.40–5.90)
RDW: 13.5 % (ref 11.5–14.5)
WBC: 6.9 10*3/uL (ref 3.8–10.6)

## 2017-02-10 LAB — BASIC METABOLIC PANEL
ANION GAP: 5 (ref 5–15)
BUN: 15 mg/dL (ref 6–20)
CO2: 26 mmol/L (ref 22–32)
Calcium: 9.3 mg/dL (ref 8.9–10.3)
Chloride: 110 mmol/L (ref 101–111)
Creatinine, Ser: 1.34 mg/dL — ABNORMAL HIGH (ref 0.61–1.24)
GFR calc Af Amer: >60 mL/min (ref >60)
GFR calc non Af Amer: 60 mL/min — ABNORMAL LOW (ref >60)
GLUCOSE: 88 mg/dL (ref 65–99)
POTASSIUM: 4 mmol/L (ref 3.5–5.1)
Sodium: 141 mmol/L (ref 135–145)

## 2017-02-10 LAB — TROPONIN I: Troponin I: <0.03 ng/mL (ref <0.03)

## 2017-02-10 NOTE — ED Triage Notes (Signed)
Pt to triage via wheelchair  Pt has chest pain for 3 days.  No sob.  cig smoker.  nonradiating pain in left chest.  Pt alert.

## 2017-02-11 ENCOUNTER — Emergency Department
Admission: EM | Admit: 2017-02-11 | Discharge: 2017-02-11 | Disposition: A | Discharge status: Home / Self Care | Payer: BLUE CROSS/BLUE SHIELD | Attending: Emergency Medicine | Admitting: Emergency Medicine

## 2017-02-11 DIAGNOSIS — R079 Chest pain, unspecified: Secondary | ICD-10-CM

## 2017-02-11 LAB — LIPASE, BLOOD: Lipase: 32 U/L (ref 11–51)

## 2017-02-11 LAB — TROPONIN I: Troponin I: <0.03 ng/mL (ref <0.03)

## 2017-02-11 LAB — FIBRIN DERIVATIVES D-DIMER (ARMC ONLY): Fibrin derivatives D-dimer (ARMC): 164.03 (ref 0.00–499.00)

## 2017-02-11 NOTE — ED Provider Notes (Signed)
[facility] Emergency Department Provider Note   First MD Initiated Contact with Patient 02/11/17 0032     (approximate)  I have reviewed the triage vital signs and the nursing notes.   HISTORY  Chief Complaint Chest Pain    HPI Walter Huffman is a 51 y.o. male with no history of chronic medical conditions presents to the emergency department with three-day history of intermittent low left-sided chest pain that is nonradiating. Patient denies any dyspnea no diaphoresis or dizziness. Patient denies any nausea or vomiting. Patient denies any history of coronary artery disease or DVT or PE. Patient does admit to one pack per day cigarette use.   Past Medical History:  Diagnosis Date  . Bell's palsy     There are no active problems to display for this patient.   Past Surgical History:  Procedure Laterality Date  . COLONOSCOPY    . DENTAL RESTORATION/EXTRACTION WITH X-RAY     had teeth removed  . HERNIA REPAIR    . I&D EXTREMITY Left 01/11/2016   Procedure: IRRIGATION AND DEBRIDEMENT OF WRIST;  Surgeon: Iran Planas, MD;  Location: [facility] [place];  Service: Orthopedics;  Laterality: Left;    Prior to Admission medications   Medication Sig Start Date End Date Taking? Authorizing Provider  docusate sodium (COLACE) 100 MG capsule Take 1 capsule (100 mg total) by mouth 2 (two) times daily. 01/11/16   Iran Planas, MD  doxycycline (VIBRA-TABS) 100 MG tablet Take 1 tablet (100 mg total) by mouth 2 (two) times daily. 01/11/16   Iran Planas, MD  naproxen sodium (ANAPROX) 220 MG tablet Take 660 mg by mouth daily as needed (general pain).    [provider]  oxyCODONE-acetaminophen (ROXICET) 5-325 MG tablet Take 1 tablet by mouth every 4 (four) hours as needed for severe pain. 01/11/16   Iran Planas, MD  sulfamethoxazole-trimethoprim (BACTRIM DS,SEPTRA DS) 800-160 MG tablet Take 1 tablet by mouth 2 (two) times daily.    [provider]     Allergies No known drug allergies  No family history on file.  Social History Social History  Substance Use Topics  . Smoking status: Current Every Day Smoker    Packs/day: 1.00    Years: 20.00    Types: Cigarettes  . Smokeless tobacco: Never Used  . Alcohol use No    Review of Systems Constitutional: No fever/chills Eyes: No visual changes. ENT: No sore throat. Cardiovascular: Positive for chest pain. Respiratory: Denies shortness of breath. Gastrointestinal: No abdominal pain.  No nausea, no vomiting.  No diarrhea.  No constipation. Genitourinary: Negative for dysuria. Musculoskeletal: Negative for neck pain.  Negative for back pain. Integumentary: Negative for rash. Neurological: Negative for headaches, focal weakness or numbness.  ____________________________________________   PHYSICAL EXAM:  VITAL SIGNS: ED Triage Vitals  Enc Vitals Group     BP 02/10/17 2304 (!) 147/94     Pulse Rate 02/10/17 2304 (!) 59     Resp 02/10/17 2304 20     Temp 02/10/17 2304 98.6 F (37 C)     Temp Source 02/10/17 2304 Oral     SpO2 02/10/17 2304 98 %     Weight 02/10/17 2256 63.5 kg (140 lb)     Height 02/10/17 2256 1.702 m (5\' 7" )     Head Circumference --      Peak Flow --      Pain Score 02/10/17 2256 5     Pain Loc --  Pain Edu? --      Excl. in [place]? --     Constitutional: Alert and oriented. Well appearing and in no acute distress. Eyes: Conjunctivae are normal. Head: Atraumatic. Mouth/Throat: Mucous membranes are moist.  Oropharynx non-erythematous. Neck: No stridor.   Cardiovascular: Normal rate, regular rhythm. Good peripheral circulation. Grossly normal heart sounds. Respiratory: Normal respiratory effort.  No retractions. Lungs CTAB. Gastrointestinal: Soft and nontender. No distention.  Musculoskeletal: No lower extremity tenderness nor edema. No gross deformities of extremities. Neurologic:  Normal speech and language. No gross focal neurologic  deficits are appreciated.  Skin:  Skin is warm, dry and intact. No rash noted. Psychiatric: Mood and affect are normal. Speech and behavior are normal.  ____________________________________________   LABS (all labs ordered are listed, but only abnormal results are displayed)  Labs Reviewed  BASIC METABOLIC PANEL - Abnormal; Notable for the following:       Result Value   Creatinine, Ser 1.34 (*)    GFR calc non Af Amer 60 (*)    All other components within normal limits  CBC  TROPONIN I  LIPASE, BLOOD  FIBRIN DERIVATIVES D-DIMER ([facility] ONLY)  TROPONIN I   ____________________________________________  EKG  ED ECG REPORT I, [place] N BROWN, the attending physician, personally viewed and interpreted this ECG.   Date: 02/12/2017  EKG Time: 11:03 PM  Rate: 52  Rhythm: Sinus bradycardia  Axis: Normal  Intervals: Normal  ST&T Change: None  ____________________________________________  RADIOLOGY I, [place] N BROWN, personally viewed and evaluated these images (plain radiographs) as part of my medical decision making, as well as reviewing the written report by the radiologist.  CLINICAL DATA:  51 year old male with chest pain.  EXAM: CHEST  2 VIEW  COMPARISON:  None.  FINDINGS: The heart size and mediastinal contours are within normal limits. Both lungs are clear. The visualized skeletal structures are unremarkable.  IMPRESSION: No active cardiopulmonary disease.   Electronically Signed   By: Anner Crete M.D.   On: 02/10/2017 23:29   Procedures   ____________________________________________   INITIAL IMPRESSION / ASSESSMENT AND PLAN / ED COURSE  Pertinent labs & imaging results that were available during my care of the patient were reviewed by me and considered in my medical decision making (see chart for details).  51 year old male presenting with left lower chest discomfort that is nonradiating. EKG revealed no evidence of ischemia or  infarction. Laboratory data unremarkable including troponin 2 as well as d-dimer. Patient low risk probability for DVT/PE. Patient however noted to be bradycardic while in the emergency department without any symptomatology. Patient denies any chest pain at present. Patient be referred to cardiology for further outpatient evaluation and management.      ____________________________________________  FINAL CLINICAL IMPRESSION(S) / ED DIAGNOSES  Final diagnoses:  Nonspecific chest pain     MEDICATIONS GIVEN DURING THIS VISIT:  Medications - No data to display   NEW OUTPATIENT MEDICATIONS STARTED DURING THIS VISIT:  Discharge Medication List as of 02/11/2017  3:46 AM      Discharge Medication List as of 02/11/2017  3:46 AM      Discharge Medication List as of 02/11/2017  3:46 AM       Note:  This document was prepared using Dragon voice recognition software and may include unintentional dictation errors.    Gregor Hams, MD 02/12/17 [phone]

## 2017-02-12 DIAGNOSIS — R0602 Shortness of breath: Secondary | ICD-10-CM | POA: Diagnosis not present

## 2017-02-12 DIAGNOSIS — I208 Other forms of angina pectoris: Secondary | ICD-10-CM | POA: Diagnosis not present

## 2017-02-12 DIAGNOSIS — R011 Cardiac murmur, unspecified: Secondary | ICD-10-CM | POA: Diagnosis not present

## 2017-02-12 DIAGNOSIS — F17200 Nicotine dependence, unspecified, uncomplicated: Secondary | ICD-10-CM | POA: Diagnosis not present

## 2017-10-09 DIAGNOSIS — R300 Dysuria: Secondary | ICD-10-CM | POA: Diagnosis not present

## 2017-10-09 DIAGNOSIS — R3129 Other microscopic hematuria: Secondary | ICD-10-CM | POA: Diagnosis not present

## 2018-07-28 ENCOUNTER — Telehealth: Payer: Self-pay | Admitting: *Deleted

## 2018-07-28 NOTE — Telephone Encounter (Signed)
Copied from [place] [phone]. Topic: General - Other >> Jul 28, 2018 10:10 AM Yvette Rack wrote: Reason for CRM: New patient appt scheduled for 08/26/18 at 3 pm with Dr. Terese Door. Pt verified mailing address for new patient paperwork

## 2018-07-29 NOTE — Telephone Encounter (Signed)
Thank you! NP paperwork mailed out.

## 2018-08-01 ENCOUNTER — Other Ambulatory Visit: Payer: Self-pay

## 2018-08-01 ENCOUNTER — Emergency Department (HOSPITAL_BASED_OUTPATIENT_CLINIC_OR_DEPARTMENT_OTHER)
Admission: EM | Admit: 2018-08-01 | Discharge: 2018-08-01 | Disposition: A | Discharge status: Home / Self Care | Payer: BLUE CROSS/BLUE SHIELD | Attending: Emergency Medicine | Admitting: Emergency Medicine

## 2018-08-01 ENCOUNTER — Encounter (HOSPITAL_BASED_OUTPATIENT_CLINIC_OR_DEPARTMENT_OTHER): Payer: Self-pay | Admitting: Emergency Medicine

## 2018-08-01 DIAGNOSIS — Z79899 Other long term (current) drug therapy: Secondary | ICD-10-CM | POA: Diagnosis not present

## 2018-08-01 DIAGNOSIS — B0229 Other postherpetic nervous system involvement: Secondary | ICD-10-CM | POA: Diagnosis not present

## 2018-08-01 DIAGNOSIS — R29810 Facial weakness: Secondary | ICD-10-CM | POA: Diagnosis not present

## 2018-08-01 DIAGNOSIS — G510 Bell's palsy: Secondary | ICD-10-CM | POA: Insufficient documentation

## 2018-08-01 DIAGNOSIS — F17210 Nicotine dependence, cigarettes, uncomplicated: Secondary | ICD-10-CM | POA: Insufficient documentation

## 2018-08-01 LAB — CBG MONITORING, ED: Glucose-Capillary: 75 mg/dL (ref 70–99)

## 2018-08-01 MED ORDER — VALACYCLOVIR HCL 1 G PO TABS: 1000.0000 mg | ORAL_TABLET | Freq: Three times a day (TID) | ORAL | 0 refills | Status: AC

## 2018-08-01 MED ORDER — VALACYCLOVIR HCL 500 MG PO TABS
1000.0000 mg | ORAL_TABLET | Freq: Once | ORAL | Status: AC
Start: 2018-08-01 — End: 2018-08-01
  Administered 2018-08-01: 1000 mg via ORAL
  Filled 2018-08-01: qty 2

## 2018-08-01 MED ORDER — PREDNISONE 20 MG PO TABS: ORAL_TABLET | ORAL | 0 refills | Status: AC

## 2018-08-01 MED ORDER — PREDNISONE 50 MG PO TABS
60.0000 mg | ORAL_TABLET | Freq: Once | ORAL | Status: AC
  Administered 2018-08-01: 60 mg via ORAL
  Filled 2018-08-01: qty 1

## 2018-08-01 NOTE — ED Provider Notes (Signed)
[facility] EMERGENCY DEPARTMENT Provider Note   CSN: [id number] Arrival date & time: 08/01/18  1140     History   Chief Complaint Chief Complaint  Patient presents with  . Facial Droop    HPI Walter Huffman is a 53 y.o. male with no significant past medical history who presents today complaining of right facial numbness and droop. Patient reports symptoms started 3 days ago with ear fullness and headache. Patient reports he took ibuprofen the headache and fullness improved but he started to experienced watery right eye yesterday. This morning patient developed slurred speech, difficulty closing his right eye, decrease sensation and inability to smile. Patient has a history of bell's palsy with last episode over 20 years ago. He is otherwise health does not take any medications denies any recent illness. He reports having an allergic reaction to the dye he was using last Sunday to color his beard and has developed some swelling from that. He denies any chest pain, SOB, abdominal pain. HPI  Past Medical History:  Diagnosis Date  . Bell's palsy     There are no active problems to display for this patient.   Past Surgical History:  Procedure Laterality Date  . COLONOSCOPY    . DENTAL RESTORATION/EXTRACTION WITH X-RAY     had teeth removed  . HERNIA REPAIR    . I&D EXTREMITY Left 01/11/2016   Procedure: IRRIGATION AND DEBRIDEMENT OF WRIST;  Surgeon: Walter Planas, MD;  Location: [facility];  Service: Orthopedics;  Laterality: Left;        Home Medications    Prior to Admission medications   Medication Sig Start Date End Date Taking? Authorizing Provider  docusate sodium (COLACE) 100 MG capsule Take 1 capsule (100 mg total) by mouth 2 (two) times daily. 01/11/16   Walter Planas, MD  doxycycline (VIBRA-TABS) 100 MG tablet Take 1 tablet (100 mg total) by mouth 2 (two) times daily. 01/11/16   Walter Planas, MD  naproxen sodium (ANAPROX) 220 MG tablet Take 660 mg by mouth daily  as needed (general pain).    [provider]  oxyCODONE-acetaminophen (ROXICET) 5-325 MG tablet Take 1 tablet by mouth every 4 (four) hours as needed for severe pain. 01/11/16   Walter Planas, MD  sulfamethoxazole-trimethoprim (BACTRIM DS,SEPTRA DS) 800-160 MG tablet Take 1 tablet by mouth 2 (two) times daily.    [provider]    Family History No family history on file.  Social History Social History   Tobacco Use  . Smoking status: Current Every Day Smoker    Packs/day: 1.00    Years: 20.00    Pack years: 20.00    Types: Cigarettes  . Smokeless tobacco: Never Used  Substance Use Topics  . Alcohol use: No  . Drug use: No     Allergies   Patient has no known allergies.   Review of Systems Review of Systems  HENT: Positive for ear discharge, ear pain and voice change.   Eyes: Positive for visual disturbance.  Respiratory: Negative.   Cardiovascular: Negative.   Gastrointestinal: Negative.   Genitourinary: Negative.   Musculoskeletal: Negative.   Neurological: Positive for facial asymmetry and headaches.  Hematological: Negative.   Psychiatric/Behavioral: Negative.      Physical Exam Updated Vital Signs BP (!) 173/114 (BP Location: Left Arm)   Pulse 64   Temp 98.3 F (36.8 C) (Oral)   Resp 18   Ht 5\' 9"  (1.753 m)   Wt 65.8 kg   SpO2 99%  BMI 21.41 kg/m   Physical Exam Constitutional:      Appearance: Normal appearance. He is normal weight.  HENT:     Head: Normocephalic and atraumatic.     Right Ear: Tympanic membrane normal.     Left Ear: Tympanic membrane normal.     Nose: Nose normal.     Mouth/Throat:     Mouth: Mucous membranes are moist.     Comments:  2 small white lesions non bleeding noted around tonsillar pillar on the right side. Uvula is midline, no exudates.  Neck:     Musculoskeletal: Normal range of motion.  Cardiovascular:     Rate and Rhythm: Normal rate and regular rhythm.     Pulses: Normal pulses.    Pulmonary:     Effort: Pulmonary effort is normal.     Breath sounds: Normal breath sounds.  Abdominal:     General: Abdomen is flat. Bowel sounds are normal.  Musculoskeletal: Normal range of motion.  Skin:    Findings: Lesion present.     Comments: Small non bleeding erythematous lesion noted on right chain. Non pustular. No drainage noted.  Neurological:     General: No focal deficit present.     Mental Status: He is alert and oriented to person, place, and time.     Comments: Right facial droop with forehead sparring. Slight slurred speech. Mild right sided decrease facial sensation.   Psychiatric:        Mood and Affect: Mood normal.      ED Treatments / Results  Labs (all labs ordered are listed, but only abnormal results are displayed) Labs Reviewed - No data to display  EKG None  Radiology No results found.  Procedures Procedures (including critical care time)  Medications Ordered in ED Medications - No data to display   Initial Impression / Assessment and Plan / ED Course  I have reviewed the triage vital signs and the nursing notes.  Pertinent labs & imaging results that were available during my care of the patient were reviewed by me and considered in my medical decision making (see chart for details).   Patient presented with right sided facial droop associated with numbness and slurred speech.  Patient has a history of Bell palsy with last episode 20 years ago.  Patient reports that symptoms started 3 days ago with ear fullness and pain well-controlled for ibuprofen.  Patient developed tingling in his ear which progressed to facial numbness slurred speech and droop.  On exam patient has 2 small white lesion on the tonsillar pillar on the right side of his mouth.  Findings are highly suspicious for shingles.  No other herpetic lesions noted on exam.  Patient well-appearing with normal vitals.  He was discharged on high-dose steroids after receiving 1 dose IV in  the ED.  Patient was also sent home on treatment dose Valtrex that he will take for 10 days.  Patient follow-up with PCP if symptoms do not improve in the next few days with above regimen.  Patient expressed understanding and is in agreement with the plan.  Final Clinical Impressions(s) / ED Diagnoses   Final diagnoses:  None    ED Discharge Orders    None       Marjie Skiff, MD 08/01/18 1600    Drenda Freeze, MD 08/02/18 [phone]

## 2018-08-01 NOTE — ED Notes (Signed)
ED Provider at bedside. 

## 2018-08-01 NOTE — Discharge Instructions (Signed)
We are prescribing two medications for you today: --Prednisone, you will take for the next 6 daysfor the bell's palsy. --Valtrex you will take three times a day for the next 10 days for the shingles. Make sure you follow up with you regular doctor.

## 2018-08-01 NOTE — ED Notes (Signed)
Discharged by Charge Nurse 

## 2018-08-01 NOTE — ED Triage Notes (Signed)
R side facial droop and inability to close R eye completely x 2 days. Hx of Bell's Palsy.

## 2018-08-05 ENCOUNTER — Other Ambulatory Visit (HOSPITAL_COMMUNITY)
Admission: RE | Admit: 2018-08-05 | Discharge: 2018-08-05 | Disposition: A | Discharge status: Home / Self Care | Payer: BLUE CROSS/BLUE SHIELD | Source: Ambulatory Visit | Attending: Internal Medicine | Admitting: Internal Medicine

## 2018-08-05 ENCOUNTER — Encounter: Payer: Self-pay | Admitting: Internal Medicine

## 2018-08-05 ENCOUNTER — Ambulatory Visit (INDEPENDENT_AMBULATORY_CARE_PROVIDER_SITE_OTHER): Payer: BLUE CROSS/BLUE SHIELD | Admitting: Internal Medicine

## 2018-08-05 VITALS — BP 136/90 | HR 64 | Temp 98.1°F | Ht 69.0 in | Wt 150.2 lb

## 2018-08-05 DIAGNOSIS — Z125 Encounter for screening for malignant neoplasm of prostate: Secondary | ICD-10-CM | POA: Diagnosis not present

## 2018-08-05 DIAGNOSIS — B009 Herpesviral infection, unspecified: Secondary | ICD-10-CM

## 2018-08-05 DIAGNOSIS — H53149 Visual discomfort, unspecified: Secondary | ICD-10-CM

## 2018-08-05 DIAGNOSIS — N50811 Right testicular pain: Secondary | ICD-10-CM | POA: Insufficient documentation

## 2018-08-05 DIAGNOSIS — N5082 Scrotal pain: Secondary | ICD-10-CM

## 2018-08-05 DIAGNOSIS — Z1389 Encounter for screening for other disorder: Secondary | ICD-10-CM | POA: Diagnosis not present

## 2018-08-05 DIAGNOSIS — Z1211 Encounter for screening for malignant neoplasm of colon: Secondary | ICD-10-CM

## 2018-08-05 DIAGNOSIS — K1379 Other lesions of oral mucosa: Secondary | ICD-10-CM

## 2018-08-05 DIAGNOSIS — G510 Bell's palsy: Secondary | ICD-10-CM | POA: Insufficient documentation

## 2018-08-05 DIAGNOSIS — Z113 Encounter for screening for infections with a predominantly sexual mode of transmission: Secondary | ICD-10-CM | POA: Diagnosis not present

## 2018-08-05 DIAGNOSIS — E559 Vitamin D deficiency, unspecified: Secondary | ICD-10-CM

## 2018-08-05 DIAGNOSIS — Z1159 Encounter for screening for other viral diseases: Secondary | ICD-10-CM

## 2018-08-05 DIAGNOSIS — Z1329 Encounter for screening for other suspected endocrine disorder: Secondary | ICD-10-CM | POA: Diagnosis not present

## 2018-08-05 DIAGNOSIS — R739 Hyperglycemia, unspecified: Secondary | ICD-10-CM | POA: Diagnosis not present

## 2018-08-05 DIAGNOSIS — R319 Hematuria, unspecified: Secondary | ICD-10-CM

## 2018-08-05 DIAGNOSIS — Z0184 Encounter for antibody response examination: Secondary | ICD-10-CM | POA: Diagnosis not present

## 2018-08-05 DIAGNOSIS — R030 Elevated blood-pressure reading, without diagnosis of hypertension: Secondary | ICD-10-CM

## 2018-08-05 LAB — COMPREHENSIVE METABOLIC PANEL
ALT: 17 U/L (ref 0–53)
AST: 14 U/L (ref 0–37)
Albumin: 4.3 g/dL (ref 3.5–5.2)
Alkaline Phosphatase: 87 U/L (ref 39–117)
BILIRUBIN TOTAL: 0.5 mg/dL (ref 0.2–1.2)
BUN: 14 mg/dL (ref 6–23)
CHLORIDE: 101 meq/L (ref 96–112)
CO2: 31 meq/L (ref 19–32)
CREATININE: 0.98 mg/dL (ref 0.40–1.50)
Calcium: 10.3 mg/dL (ref 8.4–10.5)
GFR: 96.9 mL/min (ref >60.00)
Glucose, Bld: 81 mg/dL (ref 70–99)
Potassium: 3.5 mEq/L (ref 3.5–5.1)
SODIUM: 137 meq/L (ref 135–145)
Total Protein: 7.8 g/dL (ref 6.0–8.3)

## 2018-08-05 LAB — HEMOGLOBIN A1C: Hgb A1c MFr Bld: 5.1 % (ref 4.6–6.5)

## 2018-08-05 LAB — CBC WITH DIFFERENTIAL/PLATELET
BASOS ABS: 0.1 10*3/uL (ref 0.0–0.1)
BASOS PCT: 0.8 % (ref 0.0–3.0)
EOS ABS: 0.2 10*3/uL (ref 0.0–0.7)
Eosinophils Relative: 1.4 % (ref 0.0–5.0)
HCT: 47.8 % (ref 39.0–52.0)
Hemoglobin: 16.2 g/dL (ref 13.0–17.0)
LYMPHS ABS: 2.1 10*3/uL (ref 0.7–4.0)
Lymphocytes Relative: 20 % (ref 12.0–46.0)
MCHC: 33.9 g/dL (ref 30.0–36.0)
MCV: 94.2 fl (ref 78.0–100.0)
MONO ABS: 0.6 10*3/uL (ref 0.1–1.0)
Monocytes Relative: 6.1 % (ref 3.0–12.0)
NEUTROS ABS: 7.5 10*3/uL (ref 1.4–7.7)
NEUTROS PCT: 71.7 % (ref 43.0–77.0)
Platelets: 328 10*3/uL (ref 150.0–400.0)
RBC: 5.07 Mil/uL (ref 4.22–5.81)
RDW: 14 % (ref 11.5–15.5)
WBC: 10.5 10*3/uL (ref 4.0–10.5)

## 2018-08-05 LAB — T4, FREE: FREE T4: 1 ng/dL (ref 0.60–1.60)

## 2018-08-05 LAB — TSH: TSH: 1.53 u[IU]/mL (ref 0.35–4.50)

## 2018-08-05 LAB — VITAMIN D 25 HYDROXY (VIT D DEFICIENCY, FRACTURES): VITD: 18.42 ng/mL — ABNORMAL LOW (ref 30.00–100.00)

## 2018-08-05 LAB — PSA: PSA: 1.03 ng/mL (ref 0.10–4.00)

## 2018-08-05 NOTE — Patient Instructions (Signed)
Labs today  Referred to eye doctor in [place] and neurology in [place]  Ordered MRI brain and US testicle today   Bell Palsy, Adult  Bell palsy is a short-term inability to move muscles in part of the face. The inability to move (paralysis) results from inflammation or compression of the facial nerve, which travels along the skull and under the ear to the side of the face (7th cranial nerve). This nerve is responsible for facial movements that include blinking, closing the eyes, smiling, and frowning. What are the causes? The exact cause of this condition is not known. It may be caused by an infection from a virus, such as the chickenpox (herpes zoster), Epstein-Barr, or mumps virus. What increases the risk? You are more likely to develop this condition if:  You are pregnant.  You have diabetes.  You have had a recent infection in your nose, throat, or airways (upper respiratory infection).  You have a weakened body defense system (immune system).  You have had a facial injury, such as a fracture.  You have a family history of Bell palsy. What are the signs or symptoms? Symptoms of this condition include:  Weakness on one side of the face.  Drooping eyelid and corner of the mouth.  Excessive tearing in one eye.  Difficulty closing the eyelid.  Dry eye.  Drooling.  Dry mouth.  Changes in taste.  Change in facial appearance.  Pain behind one ear.  Ringing in one or both ears.  Sensitivity to sound in one ear.  Facial twitching.  Headache.  Impaired speech.  Dizziness.  Difficulty eating or drinking. Most of the time, only one side of the face is affected. Rarely, Bell palsy affects the whole face. How is this diagnosed? This condition is diagnosed based on:  Your symptoms.  Your medical history.  A physical exam. You may also have to see health care providers who specialize in disorders of the nerves (neurologist) or diseases and  conditions of the eye (ophthalmologist). You may have tests, such as:  A test to check for nerve damage (electromyogram).  Imaging studies, such as CT or MRI scans.  Blood tests. How is this treated? This condition affects every person differently. Sometimes symptoms go away without treatment within a couple weeks. If treatment is needed, it varies from person to person. The goal of treatment is to reduce inflammation and protect the eye from damage. Treatment for Bell palsy may include:  Medicines, such as: ? Steroids to reduce swelling and inflammation. ? Antiviral drugs. ? Pain relievers, including aspirin, acetaminophen, or ibuprofen.  Eye drops or ointment to keep your eye moist.  Eye protection, if you cannot close your eye.  Exercises or massage to regain muscle strength and function (physical therapy). Follow these instructions at home:   Take over-the-counter and prescription medicines only as told by your health care provider.  If your eye is affected: ? Keep your eye moist with eye drops or ointment as told by your health care provider. ? Follow instructions for eye care and protection as told by your health care provider.  Do any physical therapy exercises as told by your health care provider.  Keep all follow-up visits as told by your health care provider. This is important. Contact a health care provider if:  You have a fever.  Your symptoms do not get better within 2-3 weeks, or your symptoms get worse.  Your eye is red, irritated, or painful.  You have  new symptoms. Get help right away if:  You have weakness or numbness in a part of your body other than your face.  You have trouble swallowing.  You develop neck pain or stiffness.  You develop dizziness or shortness of breath. Summary  Bell palsy is a short-term inability to move muscles in part of the face. The inability to move (paralysis) results from inflammation or compression of the facial  nerve.  This condition affects every person differently. Sometimes symptoms go away without treatment within a couple weeks.  If treatment is needed, it varies from person to person. The goal of treatment is to reduce inflammation and protect the eye from damage.  Contact your health care provider if your symptoms do not get better within 2-3 weeks, or your symptoms get worse. This information is not intended to replace advice given to you by your health care provider. Make sure you discuss any questions you have with your health care provider. Document Released: 07/01/2005 Document Revised: 05/30/2017 Document Reviewed: 09/03/2016 Elsevier Interactive Patient Education  2019 [employer].

## 2018-08-05 NOTE — Progress Notes (Signed)
Pre visit review using our clinic review tool, if applicable. No additional management support is needed unless otherwise documented below in the visit note. 

## 2018-08-06 ENCOUNTER — Encounter: Payer: Self-pay | Admitting: Gastroenterology

## 2018-08-06 LAB — URINALYSIS, ROUTINE W REFLEX MICROSCOPIC
BACTERIA UA: NONE SEEN /HPF
Bilirubin Urine: NEGATIVE
Glucose, UA: NEGATIVE
Hyaline Cast: NONE SEEN /LPF
Ketones, ur: NEGATIVE
Leukocytes, UA: NEGATIVE
Nitrite: NEGATIVE
Specific Gravity, Urine: 1.027 (ref 1.001–1.03)
Squamous Epithelial / LPF: NONE SEEN /HPF (ref <=5)
pH: 5.5 (ref 5.0–8.0)

## 2018-08-06 LAB — URINE CULTURE
MICRO NUMBER:: 88963
Result:: NO GROWTH
SPECIMEN QUALITY:: ADEQUATE

## 2018-08-06 LAB — B. BURGDORFI ANTIBODIES

## 2018-08-06 LAB — HEPATITIS B SURFACE ANTIGEN: Hepatitis B Surface Ag: NONREACTIVE

## 2018-08-06 LAB — URINE CYTOLOGY ANCILLARY ONLY
Chlamydia: NEGATIVE
NEISSERIA GONORRHEA: NEGATIVE
TRICH (WINDOWPATH): NEGATIVE

## 2018-08-06 LAB — MEASLES/MUMPS/RUBELLA IMMUNITY
Mumps IgG: <9 AU/mL — ABNORMAL LOW
RUBELLA: 2.12 {index}
RUBEOLA IGG: 27.1 [AU]/ml

## 2018-08-06 LAB — HIV ANTIBODY (ROUTINE TESTING W REFLEX): HIV 1&2 Ab, 4th Generation: NONREACTIVE

## 2018-08-06 LAB — HEPATITIS B SURFACE ANTIBODY, QUANTITATIVE: Hepatitis B-Post: <5 m[IU]/mL — ABNORMAL LOW (ref >=10)

## 2018-08-06 LAB — HEPATITIS C ANTIBODY
HEP C AB: NONREACTIVE
SIGNAL TO CUT-OFF: 0.06 (ref <1.00)

## 2018-08-07 ENCOUNTER — Other Ambulatory Visit: Payer: Self-pay | Admitting: Internal Medicine

## 2018-08-07 DIAGNOSIS — E559 Vitamin D deficiency, unspecified: Secondary | ICD-10-CM

## 2018-08-07 DIAGNOSIS — B009 Herpesviral infection, unspecified: Secondary | ICD-10-CM

## 2018-08-07 DIAGNOSIS — G510 Bell's palsy: Secondary | ICD-10-CM

## 2018-08-07 LAB — HSV(HERPES SMPLX)ABS-I+II(IGG+IGM)-BLD
HSV 1 Glycoprotein G Ab, IgG: <0.91 index (ref 0.00–0.90)
HSV 2 IgG, Type Spec: 14.7 index — ABNORMAL HIGH (ref 0.00–0.90)
HSVI/II COMB AB IGM: 1.23 ratio — AB (ref 0.00–0.90)

## 2018-08-07 MED ORDER — CHOLECALCIFEROL 1.25 MG (50000 UT) PO CAPS: 50000.0000 [IU] | ORAL_CAPSULE | ORAL | 1 refills | Status: DC

## 2018-08-10 ENCOUNTER — Encounter: Payer: Self-pay | Admitting: Internal Medicine

## 2018-08-10 ENCOUNTER — Telehealth: Payer: Self-pay | Admitting: Family Medicine

## 2018-08-10 NOTE — Telephone Encounter (Signed)
Returned call to patient regarding some questions about his lab results. He voiced understanding. Also reminded him of his appointment for the MRI and U/S.

## 2018-08-10 NOTE — Telephone Encounter (Signed)
Copied from [place] [phone]. Topic: Quick Sport and exercise psychologist Patient (Clinic Use ONLY) >> Aug 07, 2018 10:00 AM Micheline Maze B wrote: Reason for CRM: ok for [facility] to give appt information. Lvm for pt to rtc. Dr. Olivia Mackie ordered MRI and ultrasound's for patient. They are all scheduled for Monday Feb. 3. He will need to arrive by 12:30 pm for check in at [facility] entrance. His MRI will begin at 1:00 and his ultrasounds will be after. If he needs to reschedule he can call [phone] option 3, option 2.

## 2018-08-10 NOTE — Telephone Encounter (Signed)
Copied from [place] [phone]. Topic: Quick Communication - See Telephone Encounter >> Aug 10, 2018  4:52 PM Vernona Rieger wrote: CRM for notification. See Telephone encounter for: 08/10/18. Pt was given his lab results earlier from the office, he said he had a hard time hearing them and would like them read to him again

## 2018-08-10 NOTE — Progress Notes (Signed)
Chief Complaint  Patient presents with  . Follow-up   New patient ED f/u 08/01/2018 for Bells palsy. No prior PCP   1. 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 He was given valtrex in the ED and prednisone still taking. He reports he recently dyed his beard which burned top on lip and was numb then he had roof of mouth with ulcerations which he shows photos of today then right eye, mouth drooped and left ear stopped up.   2. Elevated BP not medications today 3. C/o right testicular pain x 1 week noted last week  Review of Systems  Constitutional: Negative for weight loss.  HENT: Negative for hearing loss.        +oral ulcers roof of mouth    Eyes: Positive for photophobia. Negative for blurred vision.  Respiratory: Negative for shortness of breath.   Cardiovascular: Negative for chest pain.  Gastrointestinal: Negative for abdominal pain.  Musculoskeletal: Negative for falls.  Skin: Negative for rash.  Neurological: Negative for dizziness and headaches.       Right facial eyelid and mouth drooping    Psychiatric/Behavioral: Negative for depression and memory loss.   Past Medical History:  Diagnosis Date  . Allergy   . Bell's palsy   . Bell's palsy    recurrent x 3 x over the last 20 years as of 08/05/2018   . Chicken pox   . HSV infection   . Vitamin D deficiency    Past Surgical History:  Procedure Laterality Date  . COLONOSCOPY    . DENTAL RESTORATION/EXTRACTION WITH X-RAY     had teeth removed  . HERNIA REPAIR    . I&D EXTREMITY Left 01/11/2016   Procedure: IRRIGATION AND DEBRIDEMENT OF WRIST;  Surgeon: Iran Planas, MD;  Location: [facility];  Service: Orthopedics;  Laterality: Left;   Family History  Problem Relation Age of Onset  . Diabetes Mother   . Hypertension Mother   . Cancer Father        throat  . Cancer Paternal Uncle        throat   Social History    Socioeconomic History  . Marital status: Married    Spouse name: Not on file  . Number of children: Not on file  . Years of education: Not on file  . Highest education level: Not on file  Occupational History  . Not on file  Social Needs  . Financial resource strain: Not on file  . Food insecurity:    Worry: Not on file    Inability: Not on file  . Transportation needs:    Medical: Not on file    Non-medical: Not on file  Tobacco Use  . Smoking status: Current Every Day Smoker    Packs/day: 1.00    Years: 20.00    Pack years: 20.00    Types: Cigarettes  . Smokeless tobacco: Never Used  Substance and Sexual Activity  . Alcohol use: No  . Drug use: No  . Sexual activity: Yes    Birth control/protection: None  Lifestyle  . Physical activity:    Days per week: Not on file    Minutes per session: Not on file  . Stress: Not on file  Relationships  . Social connections:    Talks on phone: Not on file    Gets together: Not on file    Attends religious service:  Not on file    Active member of club or organization: Not on file    Attends meetings of clubs or organizations: Not on file    Relationship status: Not on file  . Intimate partner violence:    Fear of current or ex partner: Not on file    Emotionally abused: Not on file    Physically abused: Not on file    Forced sexual activity: Not on file  Other Topics Concern  . 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   No Known Allergies Recent Results (from the past 2160 hour(s))  CBG monitoring, ED     Status: None   Collection Time: 08/01/18 12:44 PM  Result Value Ref Range   Glucose-Capillary 75 70 - 99 mg/dL   Comment 1 FASTING   Urine cytology ancillary  only([place])     Status: None   Collection Time: 08/05/18 12:00 AM  Result Value Ref Range   Chlamydia Negative     Comment: Normal Reference Range - Negative   Neisseria gonorrhea Negative     Comment: Normal Reference Range - Negative   Trichomonas Negative     Comment: Normal Reference Range - Negative  Comprehensive metabolic panel     Status: None   Collection Time: 08/05/18 12:02 PM  Result Value Ref Range   Sodium 137 135 - 145 mEq/L   Potassium 3.5 3.5 - 5.1 mEq/L   Chloride 101 96 - 112 mEq/L   CO2 31 19 - 32 mEq/L   Glucose, Bld 81 70 - 99 mg/dL   BUN 14 6 - 23 mg/dL   Creatinine, Ser 0.98 0.40 - 1.50 mg/dL   Total Bilirubin 0.5 0.2 - 1.2 mg/dL   Alkaline Phosphatase 87 39 - 117 U/L   AST 14 0 - 37 U/L   ALT 17 0 - 53 U/L   Total Protein 7.8 6.0 - 8.3 g/dL   Albumin 4.3 3.5 - 5.2 g/dL   Calcium 10.3 8.4 - 10.5 mg/dL   GFR 96.90 >60.00 mL/min  CBC with Differential/Platelet     Status: None   Collection Time: 08/05/18 12:02 PM  Result Value Ref Range   WBC 10.5 4.0 - 10.5 K/uL   RBC 5.07 4.22 - 5.81 Mil/uL   Hemoglobin 16.2 13.0 - 17.0 g/dL   HCT 47.8 39.0 - 52.0 %   MCV 94.2 78.0 - 100.0 fl   MCHC 33.9 30.0 - 36.0 g/dL   RDW 14.0 11.5 - 15.5 %   Platelets 328.0 150.0 - 400.0 K/uL   Neutrophils Relative % 71.7 43.0 - 77.0 %   Lymphocytes Relative 20.0 12.0 - 46.0 %   Monocytes Relative 6.1 3.0 - 12.0 %   Eosinophils Relative 1.4 0.0 - 5.0 %   Basophils Relative 0.8 0.0 - 3.0 %   Neutro Abs 7.5 1.4 - 7.7 K/uL   Lymphs Abs 2.1 0.7 - 4.0 K/uL   Monocytes Absolute 0.6 0.1 - 1.0 K/uL   Eosinophils Absolute 0.2 0.0 - 0.7 K/uL   Basophils Absolute 0.1 0.0 - 0.1 K/uL  TSH     Status: None   Collection Time: 08/05/18 12:02 PM  Result  Value Ref Range   TSH 1.53 0.35 - 4.50 uIU/mL  T4, free     Status: None   Collection Time: 08/05/18 12:02 PM  Result Value Ref Range   Free T4 1.00 0.60 - 1.60 ng/dL    Comment: Specimens from patients who are undergoing  biotin therapy and /or ingesting biotin supplements may contain high levels of biotin.  The higher biotin concentration in these specimens interferes with this Free T4 assay.  Specimens that contain high levels  of biotin may cause false high results for this Free T4 assay.  Please interpret results in light of the total clinical presentation of the patient.    Vitamin D (25 hydroxy)     Status: Abnormal   Collection Time: 08/05/18 12:02 PM  Result Value Ref Range   VITD 18.42 (L) 30.00 - 100.00 ng/mL  PSA     Status: None   Collection Time: 08/05/18 12:02 PM  Result Value Ref Range   PSA 1.03 0.10 - 4.00 ng/mL    Comment: Test performed using Access Hybritech PSA Assay, a parmagnetic partical, chemiluminecent immunoassay.  HSV(herpes smplx)abs-1+2(IgG+IgM)-bld     Status: Abnormal   Collection Time: 08/05/18 12:02 PM  Result Value Ref Range   HSVI/II Comb IgM 1.23 (H) 0.00 - 0.90 Ratio    Comment:                                  Negative        <0.91                                  Equivocal 0.91 - 1.09                                  Positive        >1.09    HSV 1 Glycoprotein G Ab, IgG <0.91 0.00 - 0.90 index    Comment:                                  Negative        <0.91                                  Equivocal 0.91 - 1.09                                  Positive        >1.09  Note: Negative indicates no antibodies detected to  HSV-1. Equivocal may suggest early infection.  If  clinically appropriate, retest at later date. Positive  indicates antibodies detected to HSV-1.    HSV 2 IgG, Type Spec 14.70 (H) 0.00 - 0.90 index    Comment:                                  Negative        <0.91  Equivocal 0.91 - 1.09                                  Positive        >1.09  Note: Negative indicates no antibodies detected to  HSV-2. Equivocal may suggest early infection.  If  clinically appropriate, retest at later date. Positive  indicates  antibodies detected to HSV-2.   Hepatitis C antibody     Status: None   Collection Time: 08/05/18 12:02 PM  Result Value Ref Range   Hepatitis C Ab NON-REACTIVE NON-REACTI   SIGNAL TO CUT-OFF 0.06 <1.00    Comment: . HCV antibody was non-reactive. There is no laboratory  evidence of HCV infection. . In most cases, no further action is required. However, if recent HCV exposure is suspected, a test for HCV RNA (test code [id number]) is suggested. . For additional information please refer to http://education.[facility].com/faq/FAQ22v1 (This link is being provided for informational/ educational purposes only.) .   Hepatitis B surface antibody,quantitative     Status: Abnormal   Collection Time: 08/05/18 12:02 PM  Result Value Ref Range   Hepatitis B-Post <5 (L) > OR = 10 mIU/mL    Comment: . Patient does not have immunity to hepatitis B virus. . For additional information, please refer to http://education.[facility].com/faq/FAQ105 (This link is being provided for informational/ educational purposes only).   Hepatitis B surface antigen     Status: None   Collection Time: 08/05/18 12:02 PM  Result Value Ref Range   Hepatitis B Surface Ag NON-REACTIVE NON-REACTI  HIV antibody (with reflex)     Status: None   Collection Time: 08/05/18 12:02 PM  Result Value Ref Range   HIV 1&2 Ab, 4th Generation NON-REACTIVE NON-REACTI    Comment: HIV-1 antigen and HIV-1/HIV-2 antibodies were not detected. There is no laboratory evidence of HIV infection. Marland Kitchen PLEASE NOTE: This information has been disclosed to you from records whose confidentiality may be protected by state law.  If your state requires such protection, then the state law prohibits you from making any further disclosure of the information without the specific written consent of the person to whom it pertains, or as otherwise permitted by law. A general authorization for the release of medical or other information is  NOT sufficient for this purpose. . For additional information please refer to http://education.[facility].com/faq/FAQ106 (This link is being provided for informational/ educational purposes only.) . Marland Kitchen The performance of this assay has not been clinically validated in patients less than 37 years old. .   Measles/Mumps/Rubella Immunity     Status: Abnormal   Collection Time: 08/05/18 12:02 PM  Result Value Ref Range   Rubeola IgG 27.10 AU/mL    Comment: AU/mL            Interpretation -----            -------------- <13.50           Negative 13.50-16.49      Equivocal >16.49           Positive . A positive result indicates that the patient has antibody to measles virus. It does not differentiate  between an active or past infection. The clinical  diagnosis must be interpreted in conjunction with  clinical signs and symptoms of the patient.    Mumps IgG <9.00 (L) AU/mL    Comment:  AU/mL           Interpretation -------         ---------------- <  9.00             Negative 9.00-10.99        Equivocal >10.99            Positive A positive result indicates that the patient has  antibody to mumps virus. It does not differentiate between an  active or past infection. The clinical diagnosis must be interpreted in conjunction with clinical signs and symptoms of the patient. .    Rubella 2.12 index    Comment:     Index            Interpretation     -----            --------------       <0.90            Not consistent with Immunity     0.90-0.99        Equivocal     > or = 1.00      Consistent with Immunity  . The presence of rubella IgG antibody suggests  immunization or past or current infection with rubella virus.   B. burgdorfi antibodies     Status: None   Collection Time: 08/05/18 12:02 PM  Result Value Ref Range   B burgdorferi Ab IgG+IgM <0.90 index    Comment:                    Index                Interpretation                    -----                 --------------                    < 0.90               Negative                    0.90-1.09            Equivocal                    > 1.09               Positive . As recommended by the Food and Drug Administration  (FDA), all samples with positive or equivocal  results in a Borrelia burgdorferi antibody screen will be tested using a blot method. Positive or  equivocal screening test results should not be  interpreted as truly positive until verified as such  using a supplemental assay (e.g., B. burgdorferi blot). . The screening test and/or blot for B. burgdorferi  antibodies may be falsely negative in early stages of Lyme disease, including the period when erythema  migrans is apparent. .   Hemoglobin A1c     Status: None   Collection Time: 08/05/18 12:02 PM  Result Value Ref Range   Hgb A1c MFr Bld 5.1 4.6 - 6.5 %    Comment: Glycemic Control Guidelines for People with Diabetes:Non Diabetic:  <6%Goal of Therapy: <7%Additional Action Suggested:  >8%   Urinalysis, Routine w reflex microscopic     Status: Abnormal   Collection Time: 08/05/18 12:03 PM  Result Value Ref Range   Color, Urine DARK YELLOW YELLOW   APPearance CLOUDY (A) CLEAR   Specific Gravity, Urine 1.027 1.001 - 1.03   pH 5.5 5.0 -  8.0   Glucose, UA NEGATIVE NEGATIVE   Bilirubin Urine NEGATIVE NEGATIVE   Ketones, ur NEGATIVE NEGATIVE   Hgb urine dipstick 3+ (A) NEGATIVE   Protein, ur 1+ (A) NEGATIVE   Nitrite NEGATIVE NEGATIVE   Leukocytes, UA NEGATIVE NEGATIVE   WBC, UA 0-5 0 - 5 /HPF   RBC / HPF > OR = 60 (A) 0 - 2 /HPF   Squamous Epithelial / LPF NONE SEEN < OR = 5 /HPF   Bacteria, UA NONE SEEN NONE SEEN /HPF   Hyaline Cast NONE SEEN NONE SEEN /LPF  Urine Culture     Status: None   Collection Time: 08/05/18 12:19 PM  Result Value Ref Range   MICRO NUMBER: [id number]    SPECIMEN QUALITY: Adequate    Sample Source NOT GIVEN    STATUS: FINAL    Result: No Growth    Objective  Body mass index is 22.18  kg/m. Wt Readings from Last 3 Encounters:  08/05/18 150 lb 3.2 oz (68.1 kg)  08/01/18 145 lb (65.8 kg)  02/10/17 140 lb (63.5 kg)   Temp Readings from Last 3 Encounters:  08/05/18 98.1 F (36.7 C) (Oral)  08/01/18 98.3 F (36.8 C) (Oral)  02/10/17 98.6 F (37 C) (Oral)   BP Readings from Last 3 Encounters:  08/05/18 136/90  08/01/18 (!) 173/114  02/11/17 (!) 136/95   Pulse Readings from Last 3 Encounters:  08/05/18 64  08/01/18 64  02/11/17 (!) 57    Physical Exam Vitals signs and nursing note reviewed.  Constitutional:      Appearance: Normal appearance. He is well-developed, well-groomed and normal weight.  HENT:     Head: Normocephalic and atraumatic.     Nose: Nose normal.     Mouth/Throat:     Mouth: Mucous membranes are moist.     Pharynx: Oropharynx is clear.     Comments: No oral ulcers roof of mouth   Eyes:     Conjunctiva/sclera: Conjunctivae normal.     Pupils: Pupils are equal, round, and reactive to light.  Cardiovascular:     Rate and Rhythm: Normal rate and regular rhythm.     Heart sounds: Normal heart sounds. No murmur.  Pulmonary:     Effort: Pulmonary effort is normal.     Breath sounds: Normal breath sounds.  Skin:    General: Skin is warm and dry.  Neurological:     General: No focal deficit present.     Mental Status: He is alert and oriented to person, place, and time. Mental status is at baseline.     Cranial Nerves: Facial asymmetry present.     Gait: Gait normal.     Comments: Right eyelid and mouth droop Right eye photophobia    Psychiatric:        Attention and Perception: Attention and perception normal.        Mood and Affect: Mood and affect normal.        Speech: Speech normal.        Behavior: Behavior normal. Behavior is cooperative.        Thought Content: Thought content normal.        Cognition and Memory: Cognition and memory normal.        Judgment: Judgment normal.     Assessment   1. Recurrent Bells palsy x  3rd episode over 20 years right right eye photophobia, right eye and mouth facial droop  2. Elevated BP 3. Vitamin D def  4.  HSV 2 + serology  5. Right testicular pain x 1 week  6. HM Plan   1.  Refer to [facility]  Refer [facility]  Refer [facility] for EGD/colonoscopy  MRI brain to include temporal/parotid  Labs lyme, hsv, A1C basic labs  Complete valtrex and prednisone  2.  Monitor  3. D3 50K weekly x 6 months then otc D3 5000 IU daily  4.  Will do prn valtrex could be related recurrent bells  5.  Testicular US to w/u for now Check STD labs If neg eval inguinal hernia in future  6. HM Never had flu shot  ? Date on Tdap  Disc shingirix in future  Consider pna 23 in future  Will rec hep B vx in future and MMR vx   Check labs today will need to check lipid in future  Smoker 1 ppd since age 76 y.o no FH lung cancer, rec smoking cessation  PSA today  Colonoscopy/EGD referred [facility] in [place] h/o ulcerations in mouth ? HSV + serology  Of note hematuria may need urology w/u in future   Provider: Dr. Olivia Mackie McLean-Scocuzza-Internal Medicine

## 2018-08-12 NOTE — Progress Notes (Signed)
It has been cancelled. 

## 2018-08-17 ENCOUNTER — Encounter: Payer: Self-pay | Admitting: *Deleted

## 2018-08-17 ENCOUNTER — Ambulatory Visit
Admission: RE | Admit: 2018-08-17 | Discharge: 2018-08-17 | Disposition: A | Discharge status: Home / Self Care | Payer: BLUE CROSS/BLUE SHIELD | Source: Ambulatory Visit | Attending: Internal Medicine | Admitting: Internal Medicine

## 2018-08-17 ENCOUNTER — Other Ambulatory Visit: Payer: BLUE CROSS/BLUE SHIELD

## 2018-08-17 DIAGNOSIS — N5082 Scrotal pain: Secondary | ICD-10-CM | POA: Insufficient documentation

## 2018-08-17 DIAGNOSIS — N433 Hydrocele, unspecified: Secondary | ICD-10-CM | POA: Diagnosis not present

## 2018-08-17 DIAGNOSIS — G510 Bell's palsy: Secondary | ICD-10-CM

## 2018-08-17 MED ORDER — GADOBUTROL 1 MMOL/ML IV SOLN
6.0000 mL | Freq: Once | INTRAVENOUS | Status: AC | PRN
  Administered 2018-08-17: 6 mL via INTRAVENOUS

## 2018-08-19 ENCOUNTER — Encounter: Payer: Self-pay | Admitting: *Deleted

## 2018-08-21 ENCOUNTER — Ambulatory Visit (INDEPENDENT_AMBULATORY_CARE_PROVIDER_SITE_OTHER): Payer: BLUE CROSS/BLUE SHIELD | Admitting: Gastroenterology

## 2018-08-21 ENCOUNTER — Other Ambulatory Visit (INDEPENDENT_AMBULATORY_CARE_PROVIDER_SITE_OTHER): Payer: BLUE CROSS/BLUE SHIELD

## 2018-08-21 ENCOUNTER — Encounter: Payer: Self-pay | Admitting: Gastroenterology

## 2018-08-21 VITALS — BP 114/80 | HR 78 | Ht 69.0 in | Wt 149.0 lb

## 2018-08-21 DIAGNOSIS — Z1211 Encounter for screening for malignant neoplasm of colon: Secondary | ICD-10-CM

## 2018-08-21 DIAGNOSIS — K121 Other forms of stomatitis: Secondary | ICD-10-CM | POA: Diagnosis not present

## 2018-08-21 LAB — IGA: IgA: 347 mg/dL (ref 68–378)

## 2018-08-21 MED ORDER — NA SULFATE-K SULFATE-MG SULF 17.5-3.13-1.6 GM/177ML PO SOLN: 1.0000 | ORAL | 0 refills | Status: DC

## 2018-08-21 NOTE — Patient Instructions (Signed)
Your provider has requested that you go to the basement level for lab work before leaving today. Press "B" on the elevator. The lab is located at the first door on the left as you exit the elevator.  You have been scheduled for a colonoscopy. Please follow written instructions given to you at your visit today.  Please pick up your prep supplies at the pharmacy within the next 1-3 days. If you use inhalers (even only as needed), please bring them with you on the day of your procedure. Your physician has requested that you go to www.startemmi.com and enter the access code given to you at your visit today. This web site gives a general overview about your procedure. However, you should still follow specific instructions given to you by our office regarding your preparation for the procedure.  

## 2018-08-21 NOTE — Progress Notes (Signed)
Referring Provider: McLean-Scocuzza, Olivia Mackie * Primary Care Physician:  McLean-Scocuzza, Olivia Mackie   Reason for Consultation:  Oral ulcer   IMPRESSION:  Isolated oral ulcer, no resolved    - treated as shingles by Urgent Care with resolution of symptoms HSV 2+ No prior colon cancer screening No known family history of colon cancer or polyps No family history of celiac disease  Isolated oral ulcer now resolved. No prior history of ulcers.  No chronic GI symptoms.  Unlikely to be celiac, IBD, or Behcet's. Will screen for celiac with serologic testing.  I am recommending a colonoscopy for routine colon cancer screening.   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 Terese Door for further evaluation of ulceration in his mouth.  History is obtained to the patient review of his electronic health record.  He has a history of recurrent Bell's palsy.  He had one, small painful oral ulcers on the roof of his mouth and vesicles on his beard in mid-January. The Urgent Care doctor diagnosed him with shingles with Valtrex and prednisone.  Ulcer cleared quickly. No other associated symptoms. No identified exacerbating or relieving features.   No prior history of oral ulcers or genital ulcers.  No GI symptoms of any kind. No extraGI manifestations of IBD.  Labs from 08/05/2018 show a normal comprehensive metabolic panel including liver enzymes.  Albumin is 4.3.  CBC is normal with a white count of 10.5, hemoglobin 16.2, platelets 328.0.  TSH normal at 1.53.  Testing for hepatitis B and C was negative.  HIV nonreactive.  Testing was positive for HSV 2.  No family history of celiac disease, colon cancer, or colon polyps.  No prior colonoscopy or colon cancer screening.    Past Medical History:  Diagnosis Date  . Allergy   . Bell's palsy   . Bell's palsy    recurrent x 3 x  over the last 20 years as of 08/05/2018   . Chicken pox   . HSV infection   . Vitamin D deficiency     Past Surgical History:  Procedure Laterality Date  . COLONOSCOPY    . DENTAL RESTORATION/EXTRACTION WITH X-RAY     had teeth removed  . HERNIA REPAIR    . I&D EXTREMITY Left 01/11/2016   Procedure: IRRIGATION AND DEBRIDEMENT OF WRIST;  Surgeon: Iran Planas, MD;  Location: [facility];  Service: Orthopedics;  Laterality: Left;    Current Outpatient Medications  Medication Sig Dispense Refill  . Cholecalciferol 1.25 MG (50000 UT) capsule Take 1 capsule (50,000 Units total) by mouth once a week. 13 capsule 1   No current facility-administered medications for this visit.     Allergies as of 08/21/2018  . (No Known Allergies)    Family History  Problem Relation Age of Onset  . Diabetes Mother   . Hypertension Mother   . Throat cancer Father   . Throat cancer Paternal Uncle   . Stomach cancer Neg Hx   . Colon cancer Neg Hx   . Pancreatic cancer Neg Hx     Social History   Socioeconomic History  . Marital status: Married    Spouse name: Not on file  . Number of children: Not on file  . Years of education: Not on file  . Highest education level: Not on file  Occupational History  . Not on file  Social  Needs  . Financial resource strain: Not on file  . Food insecurity:    Worry: Not on file    Inability: Not on file  . Transportation needs:    Medical: Not on file    Non-medical: Not on file  Tobacco Use  . Smoking status: Current Every Day Smoker    Packs/day: 1.00    Years: 20.00    Pack years: 20.00    Types: Cigarettes  . Smokeless tobacco: Never Used  Substance and Sexual Activity  . Alcohol use: No  . Drug use: No  . Sexual activity: Yes    Birth control/protection: None  Lifestyle  . Physical activity:    Days per week: Not on file    Minutes per session: Not on file  . Stress: Not on file  Relationships  . Social connections:    Talks on phone: Not  on file    Gets together: Not on file    Attends religious service: Not on file    Active member of club or organization: Not on file    Attends meetings of clubs or organizations: Not on file    Relationship status: Not on file  . Intimate partner violence:    Fear of current or ex partner: Not on file    Emotionally abused: Not on file    Physically abused: Not on file    Forced sexual activity: Not on file  Other Topics Concern  . 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  Filed Weights   08/21/18 0926  Weight: 149 lb (67.6 kg)    Physical Exam: Vital signs were reviewed. General:   Alert, well-nourished, pleasant and cooperative in NAD Head:  Normocephalic and atraumatic. Eyes:  Sclera clear, no icterus.   Conjunctiva pink. Mouth:  No deformity or lesions. No ulcers. No stomatitis, chelitis, on lesions on the tongue.   Neck:  Supple; no thyromegaly. Lungs:  Clear throughout to auscultation.   No wheezes.  Heart:  Regular rate and rhythm; no murmurs Abdomen:  Soft, thin, well-healed periumbilical surgical scars for herniorraphy as an infant, nontender, normal bowel sounds. No rebound or guarding. No hepatosplenomegaly Rectal:  Deferred  Msk:  Symmetrical without gross deformities. Extremities:  No gross deformities or edema. Neurologic:  Alert and  oriented x4;  grossly nonfocal Skin:  No rash or bruise. Psych:  Alert and cooperative. Normal mood and affect.   Lynnet Hefley L. Tarri Glenn, MD, MPH [facility] 08/21/2018, 10:04 AM

## 2018-08-24 LAB — TISSUE TRANSGLUTAMINASE, IGA: (tTG) Ab, IgA: 1 U/mL

## 2018-08-26 ENCOUNTER — Ambulatory Visit: Payer: BLUE CROSS/BLUE SHIELD | Admitting: Internal Medicine

## 2018-09-01 ENCOUNTER — Ambulatory Visit (AMBULATORY_SURGERY_CENTER): Payer: BLUE CROSS/BLUE SHIELD | Admitting: Gastroenterology

## 2018-09-01 ENCOUNTER — Encounter: Payer: Self-pay | Admitting: Gastroenterology

## 2018-09-01 VITALS — BP 155/99 | HR 53 | Temp 96.8°F | Resp 21 | Ht 69.0 in | Wt 146.0 lb

## 2018-09-01 DIAGNOSIS — K635 Polyp of colon: Secondary | ICD-10-CM | POA: Diagnosis not present

## 2018-09-01 DIAGNOSIS — Z1211 Encounter for screening for malignant neoplasm of colon: Secondary | ICD-10-CM | POA: Diagnosis not present

## 2018-09-01 DIAGNOSIS — D125 Benign neoplasm of sigmoid colon: Secondary | ICD-10-CM

## 2018-09-01 MED ORDER — SODIUM CHLORIDE 0.9 % IV SOLN: 500.0000 mL | Freq: Once | INTRAVENOUS | Status: DC

## 2018-09-01 NOTE — Op Note (Signed)
[facility] Patient Name: Walter Huffman Procedure Date: 09/01/2018 7:24 AM MRN: [id number] Endoscopist: Thornton Park MD, MD Age: 53 Referring MD:  Date of Birth: [birth year] Gender: Male Account #: [id number] Procedure:                Colonoscopy Indications:              Screening for colorectal malignant neoplasm. No                            known family history of colon cancer or polyps. No                            baseline GI symptoms. Medicines:                See the Anesthesia note for documentation of the                            administered medications Procedure:                Pre-Anesthesia Assessment:                           - Prior to the procedure, a History and Physical                            was performed, and patient medications and                            allergies were reviewed. The patient's tolerance of                            previous anesthesia was also reviewed. The risks                            and benefits of the procedure and the sedation                            options and risks were discussed with the patient.                            All questions were answered, and informed consent                            was obtained. Prior Anticoagulants: The patient has                            taken no previous anticoagulant or antiplatelet                            agents. ASA Grade Assessment: II - A patient with                            mild systemic disease. After reviewing the risks  and benefits, the patient was deemed in                            satisfactory condition to undergo the procedure.                           After obtaining informed consent, the colonoscope                            was passed under direct vision. Throughout the                            procedure, the patient's blood pressure, pulse, and                            oxygen saturations were monitored  continuously. The                            Colonoscope was introduced through the anus and                            advanced to the the terminal ileum, with                            identification of the appendiceal orifice and IC                            valve. The colonoscopy was performed without                            difficulty. The patient tolerated the procedure                            well. The quality of the bowel preparation was                            good. The terminal ileum, ileocecal valve,                            appendiceal orifice, and rectum were photographed. Scope In: 8:07:05 AM Scope Out: 8:22:27 AM Scope Withdrawal Time: 0 hours 12 minutes 1 second  Total Procedure Duration: 0 hours 15 minutes 22 seconds  Findings:                 The perianal and digital rectal examinations were                            normal.                           A 1 mm poswsible polyp was found in the distal                            sigmoid colon. The polyp was sessile. The polyp was  removed with a cold biopsy forceps. Resection and                            retrieval were complete.                           The exam was otherwise without abnormality. Complications:            No immediate complications. Estimated Blood Loss:     Estimated blood loss: none. Impression:               - 17mm possiblepolyp in the sigmoid colon. Resected                            and retrieved.                           - No specimens collected. Recommendation:           - Patient has a contact number available for                            emergencies. The signs and symptoms of potential                            delayed complications were discussed with the                            patient. Return to normal activities tomorrow.                            Written discharge instructions were provided to the                            patient.                            - Resume regular diet today.                           - Continue present medications.                           - Await pathology results. Repeat colonoscopy in 5                            years if the polyp is an adenoma, otherwise                            continue with routine colon cancer screening. Thornton Park MD, MD 09/01/2018 8:30:12 AM This report has been signed electronically.

## 2018-09-01 NOTE — Progress Notes (Signed)
Called to room to assist during endoscopic procedure.  Patient ID and intended procedure confirmed with present staff. Received instructions for my participation in the procedure from the performing physician.  

## 2018-09-01 NOTE — Progress Notes (Signed)
PT taken to PACU. Monitors in place. VSS. Report given to RN. 

## 2018-09-01 NOTE — Patient Instructions (Signed)
Thank you for allowing [place] to care for you today!  Await pathology results by mail, approximately 2 weeks.  Resume previous diet and medications today.  Return to normal activities tomorrow.     YOU HAD AN ENDOSCOPIC PROCEDURE TODAY AT [facility]:   Refer to the procedure report that was given to you for any specific questions about what was found during the examination.  If the procedure report does not answer your questions, please call your gastroenterologist to clarify.  If you requested that your care partner not be given the details of your procedure findings, then the procedure report has been included in a sealed envelope for you to review at your convenience later.  YOU SHOULD EXPECT: Some feelings of bloating in the abdomen. Passage of more gas than usual.  Walking can help get rid of the air that was put into your GI tract during the procedure and reduce the bloating. If you had a lower endoscopy (such as a colonoscopy or flexible sigmoidoscopy) you may notice spotting of blood in your stool or on the toilet paper. If you underwent a bowel prep for your procedure, you may not have a normal bowel movement for a few days.  Please Note:  You might notice some irritation and congestion in your nose or some drainage.  This is from the oxygen used during your procedure.  There is no need for concern and it should clear up in a day or so.  SYMPTOMS TO REPORT IMMEDIATELY:   Following lower endoscopy (colonoscopy or flexible sigmoidoscopy):  Excessive amounts of blood in the stool  Significant tenderness or worsening of abdominal pains  Swelling of the abdomen that is new, acute  Fever of 100F or higher    For urgent or emergent issues, a gastroenterologist can be reached at any hour by calling [phone].   DIET:  We do recommend a small meal at first, but then you may proceed to your regular diet.  Drink plenty of fluids but you should avoid alcoholic beverages  for 24 hours.  ACTIVITY:  You should plan to take it easy for the rest of today and you should NOT DRIVE or use heavy machinery until tomorrow (because of the sedation medicines used during the test).    FOLLOW UP: Our staff will call the number listed on your records the next business day following your procedure to check on you and address any questions or concerns that you may have regarding the information given to you following your procedure. If we do not reach you, we will leave a message.  However, if you are feeling well and you are not experiencing any problems, there is no need to return our call.  We will assume that you have returned to your regular daily activities without incident.  If any biopsies were taken you will be contacted by phone or by letter within the next 1-3 weeks.  Please call us at [phone] if you have not heard about the biopsies in 3 weeks.    SIGNATURES/CONFIDENTIALITY: You and/or your care partner have signed paperwork which will be entered into your electronic medical record.  These signatures attest to the fact that that the information above on your After Visit Summary has been reviewed and is understood.  Full responsibility of the confidentiality of this discharge information lies with you and/or your care-partner.

## 2018-09-02 ENCOUNTER — Telehealth: Payer: Self-pay | Admitting: *Deleted

## 2018-09-02 NOTE — Telephone Encounter (Signed)
  Follow up Call-  Call back number 09/01/2018  Post procedure Call Back phone  # [phone]  Permission to leave phone message Yes  Some recent data might be hidden     Patient questions:  Do you have a fever, pain , or abdominal swelling? No. Pain Score  0 *  Have you tolerated food without any problems? Yes.    Have you been able to return to your normal activities? Yes.    Do you have any questions about your discharge instructions: Diet   No. Medications  No. Follow up visit  No.  Do you have questions or concerns about your Care? No.  Actions: * If pain score is 4 or above: No action needed, pain <4.

## 2018-09-03 ENCOUNTER — Encounter: Payer: Self-pay | Admitting: Gastroenterology

## 2018-09-04 ENCOUNTER — Encounter: Payer: Self-pay | Admitting: Internal Medicine

## 2018-09-04 ENCOUNTER — Ambulatory Visit (INDEPENDENT_AMBULATORY_CARE_PROVIDER_SITE_OTHER): Payer: BLUE CROSS/BLUE SHIELD | Admitting: Internal Medicine

## 2018-09-04 VITALS — BP 146/108 | HR 72 | Temp 98.3°F | Ht 69.0 in | Wt 149.6 lb

## 2018-09-04 DIAGNOSIS — B009 Herpesviral infection, unspecified: Secondary | ICD-10-CM

## 2018-09-04 DIAGNOSIS — N50811 Right testicular pain: Secondary | ICD-10-CM

## 2018-09-04 DIAGNOSIS — I10 Essential (primary) hypertension: Secondary | ICD-10-CM

## 2018-09-04 DIAGNOSIS — R319 Hematuria, unspecified: Secondary | ICD-10-CM

## 2018-09-04 DIAGNOSIS — G510 Bell's palsy: Secondary | ICD-10-CM

## 2018-09-04 LAB — URINALYSIS, ROUTINE W REFLEX MICROSCOPIC
Bacteria, UA: NONE SEEN /HPF
Bilirubin Urine: NEGATIVE
Glucose, UA: NEGATIVE
Hyaline Cast: NONE SEEN /LPF
Ketones, ur: NEGATIVE
Leukocytes,Ua: NEGATIVE
Nitrite: NEGATIVE
Protein, ur: NEGATIVE
Specific Gravity, Urine: 1.02 (ref 1.001–1.03)
Squamous Epithelial / HPF: NONE SEEN /HPF (ref <=5)
WBC UA: NONE SEEN /HPF (ref 0–5)
pH: 6 (ref 5.0–8.0)

## 2018-09-04 MED ORDER — OLMESARTAN MEDOXOMIL-HCTZ 20-12.5 MG PO TABS: 1.0000 | ORAL_TABLET | Freq: Every day | ORAL | 3 refills | Status: DC

## 2018-09-04 MED ORDER — VALACYCLOVIR HCL 1 G PO TABS: 1000.0000 mg | ORAL_TABLET | Freq: Three times a day (TID) | ORAL | 3 refills | Status: DC

## 2018-09-04 NOTE — Patient Instructions (Addendum)
Hematuria, Adult Hematuria is blood in the urine. Blood may be visible in the urine, or it may be identified with a test. This condition can be caused by infections of the bladder, urethra, kidney, or prostate. Other possible causes include:  Kidney stones.  Cancer of the urinary tract.  Too much calcium in the urine.  Conditions that are passed from parent to child (inherited conditions).  Exercise that requires a lot of energy. Infections can usually be treated with medicine, and a kidney stone usually will pass through your urine. If neither of these is the cause of your hematuria, more tests may be needed to identify the cause of your symptoms. It is very important to tell your health care provider about any blood in your urine, even if it is painless or the blood stops without treatment. Blood in the urine, when it happens and then stops and then happens again, can be a symptom of a very serious condition, including cancer. There is no pain in the initial stages of many urinary cancers. Follow these instructions at home: Medicines  Take over-the-counter and prescription medicines only as told by your health care provider.  If you were prescribed an antibiotic medicine, take it as told by your health care provider. Do not stop taking the antibiotic even if you start to feel better. Eating and drinking  Drink enough fluid to keep your urine clear or pale yellow. It is recommended that you drink 3-4 quarts (2.8-3.8 L) a day. If you have been diagnosed with an infection, it is recommended that you drink cranberry juice in addition to large amounts of water.  Avoid caffeine, tea, and carbonated beverages. These tend to irritate the bladder.  Avoid alcohol because it may irritate the prostate (men). General instructions  If you have been diagnosed with a kidney stone, follow your health care provider's instructions about straining your urine to catch the stone.  Empty your bladder  often. Avoid holding urine for long periods of time.  If you are male: ? After a bowel movement, wipe from front to back and use each piece of toilet paper only once. ? Empty your bladder before and after sex.  Pay attention to any changes in your symptoms. Tell your health care provider about any changes or any new symptoms.  It is your responsibility to get your test results. Ask your health care provider, or the department performing the test, when your results will be ready.  Keep all follow-up visits as told by your health care provider. This is important. Contact a health care provider if:  You develop back pain.  You have a fever.  You have nausea or vomiting.  Your symptoms do not improve after 3 days.  Your symptoms get worse. Get help right away if:  You develop severe vomiting and are unable take medicine without vomiting.  You develop severe pain in your back or abdomen even though you are taking medicine.  You pass a large amount of blood in your urine.  You pass blood clots in your urine.  You feel very weak or like you might faint.  You faint. Summary  Hematuria is blood in the urine. It has many possible causes.  It is very important that you tell your health care provider about any blood in your urine, even if it is painless or the blood stops without treatment.  Take over-the-counter and prescription medicines only as told by your health care provider.  Drink enough fluid to  keep your urine clear or pale yellow. This information is not intended to replace advice given to you by your health care provider. Make sure you discuss any questions you have with your health care provider. Document Released: 07/01/2005 Document Revised: 08/03/2016 Document Reviewed: 08/03/2016 Elsevier Interactive Patient Education  2019 [place] DASH stands for "Dietary Approaches to Stop Hypertension." The DASH eating plan is a healthy eating plan  that has been shown to reduce high blood pressure (hypertension). It may also reduce your risk for type 2 diabetes, heart disease, and stroke. The DASH eating plan may also help with weight loss. What are tips for following this plan?  General guidelines  Avoid eating more than 2,300 mg (milligrams) of salt (sodium) a day. If you have hypertension, you may need to reduce your sodium intake to 1,500 mg a day.  Limit alcohol intake to no more than 1 drink a day for nonpregnant women and 2 drinks a day for men. One drink equals 12 oz of beer, 5 oz of wine, or 1 oz of hard liquor.  Work with your health care provider to maintain a healthy body weight or to lose weight. Ask what an ideal weight is for you.  Get at least 30 minutes of exercise that causes your heart to beat faster (aerobic exercise) most days of the week. Activities may include walking, swimming, or biking.  Work with your health care provider or diet and nutrition specialist (dietitian) to adjust your eating plan to your individual calorie needs. Reading food labels   Check food labels for the amount of sodium per serving. Choose foods with less than 5 percent of the Daily Value of sodium. Generally, foods with less than 300 mg of sodium per serving fit into this eating plan.  To find whole grains, look for the word "whole" as the first word in the ingredient list. Shopping  Buy products labeled as "low-sodium" or "no salt added."  Buy fresh foods. Avoid canned foods and premade or frozen meals. Cooking  Avoid adding salt when cooking. Use salt-free seasonings or herbs instead of table salt or sea salt. Check with your health care provider or pharmacist before using salt substitutes.  Do not fry foods. Cook foods using healthy methods such as baking, boiling, grilling, and broiling instead.  Cook with heart-healthy oils, such as olive, canola, soybean, or sunflower oil. Meal planning  Eat a balanced diet that  includes: ? 5 or more servings of fruits and vegetables each day. At each meal, try to fill half of your plate with fruits and vegetables. ? Up to 6-8 servings of whole grains each day. ? Less than 6 oz of lean meat, poultry, or fish each day. A 3-oz serving of meat is about the same size as a deck of cards. One egg equals 1 oz. ? 2 servings of low-fat dairy each day. ? A serving of nuts, seeds, or beans 5 times each week. ? Heart-healthy fats. Healthy fats called Omega-3 fatty acids are found in foods such as flaxseeds and coldwater fish, like sardines, salmon, and mackerel.  Limit how much you eat of the following: ? Canned or prepackaged foods. ? Food that is high in trans fat, such as fried foods. ? Food that is high in saturated fat, such as fatty meat. ? Sweets, desserts, sugary drinks, and other foods with added sugar. ? Full-fat dairy products.  Do not salt foods before eating.  Try to eat at least 2  vegetarian meals each week.  Eat more home-cooked food and less restaurant, buffet, and fast food.  When eating at a restaurant, ask that your food be prepared with less salt or no salt, if possible. What foods are recommended? The items listed may not be a complete list. Talk with your dietitian about what dietary choices are best for you. Grains Whole-grain or whole-wheat bread. Whole-grain or whole-wheat pasta. Brown rice. [place]. Bulgur. Whole-grain and low-sodium cereals. Pita bread. Low-fat, low-sodium crackers. Whole-wheat flour tortillas. Vegetables Fresh or frozen vegetables (raw, steamed, roasted, or grilled). Low-sodium or reduced-sodium tomato and vegetable juice. Low-sodium or reduced-sodium tomato sauce and tomato paste. Low-sodium or reduced-sodium canned vegetables. Fruits All fresh, dried, or frozen fruit. Canned fruit in natural juice (without added sugar). Meat and other protein foods Skinless chicken or [place]. Ground chicken or [place]. Pork with fat  trimmed off. Fish and seafood. Egg whites. Dried beans, peas, or lentils. Unsalted nuts, nut butters, and seeds. Unsalted canned beans. Lean cuts of beef with fat trimmed off. Low-sodium, lean deli meat. Dairy Low-fat (1%) or fat-free (skim) milk. Fat-free, low-fat, or reduced-fat cheeses. Nonfat, low-sodium ricotta or cottage cheese. Low-fat or nonfat yogurt. Low-fat, low-sodium cheese. Fats and oils Soft margarine without trans fats. Vegetable oil. Low-fat, reduced-fat, or light mayonnaise and salad dressings (reduced-sodium). Canola, safflower, olive, soybean, and sunflower oils. Avocado. Seasoning and other foods Herbs. Spices. Seasoning mixes without salt. Unsalted popcorn and pretzels. Fat-free sweets. What foods are not recommended? The items listed may not be a complete list. Talk with your dietitian about what dietary choices are best for you. Grains Baked goods made with fat, such as croissants, muffins, or some breads. Dry pasta or rice meal packs. Vegetables Creamed or fried vegetables. Vegetables in a cheese sauce. Regular canned vegetables (not low-sodium or reduced-sodium). Regular canned tomato sauce and paste (not low-sodium or reduced-sodium). Regular tomato and vegetable juice (not low-sodium or reduced-sodium). Angie Fava. Olives. Fruits Canned fruit in a light or heavy syrup. Fried fruit. Fruit in cream or butter sauce. Meat and other protein foods Fatty cuts of meat. Ribs. Fried meat. Berniece Salines. Sausage. Bologna and other processed lunch meats. Salami. Fatback. Hotdogs. Bratwurst. Salted nuts and seeds. Canned beans with added salt. Canned or smoked fish. Whole eggs or egg yolks. Chicken or [place] with skin. Dairy Whole or 2% milk, cream, and half-and-half. Whole or full-fat cream cheese. Whole-fat or sweetened yogurt. Full-fat cheese. Nondairy creamers. Whipped toppings. Processed cheese and cheese spreads. Fats and oils Butter. Stick margarine. Lard. Shortening. Ghee. Bacon fat.  Tropical oils, such as coconut, palm kernel, or palm oil. Seasoning and other foods Salted popcorn and pretzels. Onion salt, garlic salt, seasoned salt, table salt, and sea salt. Worcestershire sauce. Tartar sauce. Barbecue sauce. Teriyaki sauce. Soy sauce, including reduced-sodium. Steak sauce. Canned and packaged gravies. Fish sauce. Oyster sauce. Cocktail sauce. Horseradish that you find on the shelf. Ketchup. Mustard. Meat flavorings and tenderizers. Bouillon cubes. Hot sauce and Tabasco sauce. Premade or packaged marinades. Premade or packaged taco seasonings. Relishes. Regular salad dressings. Where to find more information:  National Heart, Lung, and [place]: https://wilson-eaton.com/  American Heart Association: www.heart.org Summary  The DASH eating plan is a healthy eating plan that has been shown to reduce high blood pressure (hypertension). It may also reduce your risk for type 2 diabetes, heart disease, and stroke.  With the DASH eating plan, you should limit salt (sodium) intake to 2,300 mg a day. If you have hypertension, you may need  to reduce your sodium intake to 1,500 mg a day.  When on the DASH eating plan, aim to eat more fresh fruits and vegetables, whole grains, lean proteins, low-fat dairy, and heart-healthy fats.  Work with your health care provider or diet and nutrition specialist (dietitian) to adjust your eating plan to your individual calorie needs. This information is not intended to replace advice given to you by your health care provider. Make sure you discuss any questions you have with your health care provider. Document Released: 06/20/2011 Document Revised: 06/24/2016 Document Reviewed: 06/24/2016 Elsevier Interactive Patient Education  2019 [employer].  Hypertension Hypertension, commonly called high blood pressure, is when the force of blood pumping through the arteries is too strong. The arteries are the blood vessels that carry blood from the heart  throughout the body. Hypertension forces the heart to work harder to pump blood and may cause arteries to become narrow or stiff. Having untreated or uncontrolled hypertension can cause heart attacks, strokes, kidney disease, and other problems. A blood pressure reading consists of a higher number over a lower number. Ideally, your blood pressure should be below 120/80. The first ("top") number is called the systolic pressure. It is a measure of the pressure in your arteries as your heart beats. The second ("bottom") number is called the diastolic pressure. It is a measure of the pressure in your arteries as the heart relaxes. What are the causes? The cause of this condition is not known. What increases the risk? Some risk factors for high blood pressure are under your control. Others are not. Factors you can change  Smoking.  Having type 2 diabetes mellitus, high cholesterol, or both.  Not getting enough exercise or physical activity.  Being overweight.  Having too much fat, sugar, calories, or salt (sodium) in your diet.  Drinking too much alcohol. Factors that are difficult or impossible to change  Having chronic kidney disease.  Having a family history of high blood pressure.  Age. Risk increases with age.  Race. You may be at higher risk if you are African-American.  Gender. Men are at higher risk than women before age 45. After age 36, women are at higher risk than men.  Having obstructive sleep apnea.  Stress. What are the signs or symptoms? Extremely high blood pressure (hypertensive crisis) may cause:  Headache.  Anxiety.  Shortness of breath.  Nosebleed.  Nausea and vomiting.  Severe chest pain.  Jerky movements you cannot control (seizures). How is this diagnosed? This condition is diagnosed by measuring your blood pressure while you are seated, with your arm resting on a surface. The cuff of the blood pressure monitor will be placed directly against the  skin of your upper arm at the level of your heart. It should be measured at least twice using the same arm. Certain conditions can cause a difference in blood pressure between your right and left arms. Certain factors can cause blood pressure readings to be lower or higher than normal (elevated) for a short period of time:  When your blood pressure is higher when you are in a health care provider's office than when you are at home, this is called white coat hypertension. Most people with this condition do not need medicines.  When your blood pressure is higher at home than when you are in a health care provider's office, this is called masked hypertension. Most people with this condition may need medicines to control blood pressure. If you have a high blood pressure  reading during one visit or you have normal blood pressure with other risk factors:  You may be asked to return on a different day to have your blood pressure checked again.  You may be asked to monitor your blood pressure at home for 1 week or longer. If you are diagnosed with hypertension, you may have other blood or imaging tests to help your health care provider understand your overall risk for other conditions. How is this treated? This condition is treated by making healthy lifestyle changes, such as eating healthy foods, exercising more, and reducing your alcohol intake. Your health care provider may prescribe medicine if lifestyle changes are not enough to get your blood pressure under control, and if:  Your systolic blood pressure is above 130.  Your diastolic blood pressure is above 80. Your personal target blood pressure may vary depending on your medical conditions, your age, and other factors. Follow these instructions at home: Eating and drinking   Eat a diet that is high in fiber and potassium, and low in sodium, added sugar, and fat. An example eating plan is called the DASH (Dietary Approaches to Stop Hypertension)  diet. To eat this way: ? Eat plenty of fresh fruits and vegetables. Try to fill half of your plate at each meal with fruits and vegetables. ? Eat whole grains, such as whole wheat pasta, brown rice, or whole grain bread. Fill about one quarter of your plate with whole grains. ? Eat or drink low-fat dairy products, such as skim milk or low-fat yogurt. ? Avoid fatty cuts of meat, processed or cured meats, and poultry with skin. Fill about one quarter of your plate with lean proteins, such as fish, chicken without skin, beans, eggs, and tofu. ? Avoid premade and processed foods. These tend to be higher in sodium, added sugar, and fat.  Reduce your daily sodium intake. Most people with hypertension should eat less than 1,500 mg of sodium a day.  Limit alcohol intake to no more than 1 drink a day for nonpregnant women and 2 drinks a day for men. One drink equals 12 oz of beer, 5 oz of wine, or 1 oz of hard liquor. Lifestyle   Work with your health care provider to maintain a healthy body weight or to lose weight. Ask what an ideal weight is for you.  Get at least 30 minutes of exercise that causes your heart to beat faster (aerobic exercise) most days of the week. Activities may include walking, swimming, or biking.  Include exercise to strengthen your muscles (resistance exercise), such as pilates or lifting weights, as part of your weekly exercise routine. Try to do these types of exercises for 30 minutes at least 3 days a week.  Do not use any products that contain nicotine or tobacco, such as cigarettes and e-cigarettes. If you need help quitting, ask your health care provider.  Monitor your blood pressure at home as told by your health care provider.  Keep all follow-up visits as told by your health care provider. This is important. Medicines  Take over-the-counter and prescription medicines only as told by your health care provider. Follow directions carefully. Blood pressure medicines  must be taken as prescribed.  Do not skip doses of blood pressure medicine. Doing this puts you at risk for problems and can make the medicine less effective.  Ask your health care provider about side effects or reactions to medicines that you should watch for. Contact a health care provider if:  You  think you are having a reaction to a medicine you are taking.  You have headaches that keep coming back (recurring).  You feel dizzy.  You have swelling in your ankles.  You have trouble with your vision. Get help right away if:  You develop a severe headache or confusion.  You have unusual weakness or numbness.  You feel faint.  You have severe pain in your chest or abdomen.  You vomit repeatedly.  You have trouble breathing. Summary  Hypertension is when the force of blood pumping through your arteries is too strong. If this condition is not controlled, it may put you at risk for serious complications.  Your personal target blood pressure may vary depending on your medical conditions, your age, and other factors. For most people, a normal blood pressure is less than 120/80.  Hypertension is treated with lifestyle changes, medicines, or a combination of both. Lifestyle changes include weight loss, eating a healthy, low-sodium diet, exercising more, and limiting alcohol. This information is not intended to replace advice given to you by your health care provider. Make sure you discuss any questions you have with your health care provider. Document Released: 07/01/2005 Document Revised: 05/29/2016 Document Reviewed: 05/29/2016 Elsevier Interactive Patient Education  2019 [employer].

## 2018-09-04 NOTE — Progress Notes (Addendum)
Chief Complaint  Patient presents with  . Follow-up   F/u with wife  1. Reviewed labs, MRI, US testicle still having right testicluar pain [place] + spermatocele/complicated cyst h/o hematuria will refer to urology  2. HTN elevated not on meds  3. 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  HENT: Negative for hearing loss.   Eyes: Negative for blurred vision.  Respiratory: Negative for shortness of breath.   Cardiovascular: Negative for chest pain.  Gastrointestinal: Negative for abdominal pain.  Musculoskeletal: Negative for falls.  Skin: Negative for rash.  Neurological: Negative for headaches.  Psychiatric/Behavioral: Negative for depression.   Past Medical History:  Diagnosis Date  . Allergy   . Bell's palsy   . Bell's palsy    recurrent x 3 x over the last 20 years as of 08/05/2018   . Chicken pox   . HSV infection   . Vitamin D deficiency    Past Surgical History:  Procedure Laterality Date  . COLONOSCOPY    . DENTAL RESTORATION/EXTRACTION WITH X-RAY     had teeth removed  . HERNIA REPAIR    . I&D EXTREMITY Left 01/11/2016   Procedure: IRRIGATION AND DEBRIDEMENT OF WRIST;  Surgeon: Iran Planas, MD;  Location: [place];  Service: Orthopedics;  Laterality: Left;   Family History  Problem Relation Age of Onset  . Diabetes Mother   . Hypertension Mother   . Throat cancer Father   . Throat cancer Paternal Uncle   . Stomach cancer Neg Hx   . Colon cancer Neg Hx   . Pancreatic cancer Neg Hx    Social History   Socioeconomic History  . Marital status: Married    Spouse name: Not on file  . Number of children: Not on file  . Years of education: Not on file  . Highest education level: Not on file  Occupational History  . Not on file  Social Needs  . Financial resource strain: Not on file  . Food insecurity:     Worry: Not on file    Inability: Not on file  . Transportation needs:    Medical: Not on file    Non-medical: Not on file  Tobacco Use  . Smoking status: Current Every Day Smoker    Packs/day: 1.00    Years: 20.00    Pack years: 20.00    Types: Cigarettes  . Smokeless tobacco: Never Used  Substance and Sexual Activity  . Alcohol use: No  . Drug use: No  . Sexual activity: Yes    Birth control/protection: None  Lifestyle  . Physical activity:    Days per week: Not on file    Minutes per session: Not on file  . Stress: Not on file  Relationships  . Social connections:    Talks on phone: Not on file    Gets together: Not on file    Attends religious service: Not on file    Active member of club or organization: Not on file    Attends meetings of clubs or organizations: Not on file    Relationship status: Not on file  . Intimate partner violence:    Fear of current or ex partner: Not on file    Emotionally abused: Not on file    Physically abused: Not on file  Forced sexual activity: Not on file  Other Topics Concern  . 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 Cholecalciferol 1.25 MG (50000 UT) capsule Take 1 capsule (50,000 Units total) by mouth once a week.   No Known Allergies Recent Results (from the past 2160 hour(s))  CBG monitoring, ED     Status: None   Collection Time: 08/01/18 12:44 PM  Result Value Ref Range   Glucose-Capillary 75 70 - 99 mg/dL   Comment 1 FASTING   Urine cytology ancillary only([facility])     Status: None   Collection Time: 08/05/18 12:00 AM  Result Value Ref Range   Chlamydia Negative     Comment: Normal Reference Range - Negative   Neisseria gonorrhea Negative     Comment: Normal Reference Range - Negative   Trichomonas Negative     Comment: Normal Reference Range -  Negative  Comprehensive metabolic panel     Status: None   Collection Time: 08/05/18 12:02 PM  Result Value Ref Range   Sodium 137 135 - 145 mEq/L   Potassium 3.5 3.5 - 5.1 mEq/L   Chloride 101 96 - 112 mEq/L   CO2 31 19 - 32 mEq/L   Glucose, Bld 81 70 - 99 mg/dL   BUN 14 6 - 23 mg/dL   Creatinine, Ser 0.98 0.40 - 1.50 mg/dL   Total Bilirubin 0.5 0.2 - 1.2 mg/dL   Alkaline Phosphatase 87 39 - 117 U/L   AST 14 0 - 37 U/L   ALT 17 0 - 53 U/L   Total Protein 7.8 6.0 - 8.3 g/dL   Albumin 4.3 3.5 - 5.2 g/dL   Calcium 10.3 8.4 - 10.5 mg/dL   GFR 96.90 >60.00 mL/min  CBC with Differential/Platelet     Status: None   Collection Time: 08/05/18 12:02 PM  Result Value Ref Range   WBC 10.5 4.0 - 10.5 K/uL   RBC 5.07 4.22 - 5.81 Mil/uL   Hemoglobin 16.2 13.0 - 17.0 g/dL   HCT 47.8 39.0 - 52.0 %   MCV 94.2 78.0 - 100.0 fl   MCHC 33.9 30.0 - 36.0 g/dL   RDW 14.0 11.5 - 15.5 %   Platelets 328.0 150.0 - 400.0 K/uL   Neutrophils Relative % 71.7 43.0 - 77.0 %   Lymphocytes Relative 20.0 12.0 - 46.0 %   Monocytes Relative 6.1 3.0 - 12.0 %   Eosinophils Relative 1.4 0.0 - 5.0 %   Basophils Relative 0.8 0.0 - 3.0 %   Neutro Abs 7.5 1.4 - 7.7 K/uL   Lymphs Abs 2.1 0.7 - 4.0 K/uL   Monocytes Absolute 0.6 0.1 - 1.0 K/uL   Eosinophils Absolute 0.2 0.0 - 0.7 K/uL   Basophils Absolute 0.1 0.0 - 0.1 K/uL  TSH     Status: None   Collection Time: 08/05/18 12:02 PM  Result Value Ref Range   TSH 1.53 0.35 - 4.50 uIU/mL  T4, free     Status: None   Collection Time: 08/05/18 12:02 PM  Result Value Ref Range   Free T4 1.00 0.60 - 1.60 ng/dL    Comment: Specimens from patients who are undergoing biotin therapy and /or ingesting biotin supplements may contain high levels of biotin.  The  higher biotin concentration in these specimens interferes with this Free T4 assay.  Specimens that contain high levels  of biotin may cause false high results for this Free T4 assay.  Please interpret results in light of  the total clinical presentation of the patient.    Vitamin D (25 hydroxy)     Status: Abnormal   Collection Time: 08/05/18 12:02 PM  Result Value Ref Range   VITD 18.42 (L) 30.00 - 100.00 ng/mL  PSA     Status: None   Collection Time: 08/05/18 12:02 PM  Result Value Ref Range   PSA 1.03 0.10 - 4.00 ng/mL    Comment: Test performed using Access Hybritech PSA Assay, a parmagnetic partical, chemiluminecent immunoassay.  HSV(herpes smplx)abs-1+2(IgG+IgM)-bld     Status: Abnormal   Collection Time: 08/05/18 12:02 PM  Result Value Ref Range   HSVI/II Comb IgM 1.23 (H) 0.00 - 0.90 Ratio    Comment:                                  Negative        <0.91                                  Equivocal 0.91 - 1.09                                  Positive        >1.09    HSV 1 Glycoprotein G Ab, IgG <0.91 0.00 - 0.90 index    Comment:                                  Negative        <0.91                                  Equivocal 0.91 - 1.09                                  Positive        >1.09  Note: Negative indicates no antibodies detected to  HSV-1. Equivocal may suggest early infection.  If  clinically appropriate, retest at later date. Positive  indicates antibodies detected to HSV-1.    HSV 2 IgG, Type Spec 14.70 (H) 0.00 - 0.90 index    Comment:                                  Negative        <0.91                                  Equivocal 0.91 - 1.09                                  Positive        >1.09  Note: Negative indicates no antibodies detected to  HSV-2.  Equivocal may suggest early infection.  If  clinically appropriate, retest at later date. Positive  indicates antibodies detected to HSV-2.   Hepatitis C antibody     Status: None   Collection Time: 08/05/18 12:02 PM  Result Value Ref Range   Hepatitis C Ab NON-REACTIVE NON-REACTI   SIGNAL TO CUT-OFF 0.06 <1.00    Comment: . HCV antibody was non-reactive. There is no laboratory  evidence of HCV infection. . In  most cases, no further action is required. However, if recent HCV exposure is suspected, a test for HCV RNA (test code [phone]) is suggested. . For additional information please refer to http://education.[facility].com/faq/FAQ22v1 (This link is being provided for informational/ educational purposes only.) .   Hepatitis B surface antibody,quantitative     Status: Abnormal   Collection Time: 08/05/18 12:02 PM  Result Value Ref Range   Hepatitis B-Post <5 (L) > OR = 10 mIU/mL    Comment: . Patient does not have immunity to hepatitis B virus. . For additional information, please refer to http://education.[facility].com/faq/FAQ105 (This link is being provided for informational/ educational purposes only).   Hepatitis B surface antigen     Status: None   Collection Time: 08/05/18 12:02 PM  Result Value Ref Range   Hepatitis B Surface Ag NON-REACTIVE NON-REACTI  HIV antibody (with reflex)     Status: None   Collection Time: 08/05/18 12:02 PM  Result Value Ref Range   HIV 1&2 Ab, 4th Generation NON-REACTIVE NON-REACTI    Comment: HIV-1 antigen and HIV-1/HIV-2 antibodies were not detected. There is no laboratory evidence of HIV infection. Marland Kitchen PLEASE NOTE: This information has been disclosed to you from records whose confidentiality may be protected by state law.  If your state requires such protection, then the state law prohibits you from making any further disclosure of the information without the specific written consent of the person to whom it pertains, or as otherwise permitted by law. A general authorization for the release of medical or other information is NOT sufficient for this purpose. . For additional information please refer to http://education.[facility].com/faq/FAQ106 (This link is being provided for informational/ educational purposes only.) . Marland Kitchen The performance of this assay has not been clinically validated in patients less than 2 years  old. .   Measles/Mumps/Rubella Immunity     Status: Abnormal   Collection Time: 08/05/18 12:02 PM  Result Value Ref Range   Rubeola IgG 27.10 AU/mL    Comment: AU/mL            Interpretation -----            -------------- <13.50           Negative 13.50-16.49      Equivocal >16.49           Positive . A positive result indicates that the patient has antibody to measles virus. It does not differentiate  between an active or past infection. The clinical  diagnosis must be interpreted in conjunction with  clinical signs and symptoms of the patient.    Mumps IgG <9.00 (L) AU/mL    Comment:  AU/mL           Interpretation -------         ---------------- <9.00             Negative 9.00-10.99        Equivocal >10.99            Positive A positive result indicates that the patient has  antibody  to mumps virus. It does not differentiate between an  active or past infection. The clinical diagnosis must be interpreted in conjunction with clinical signs and symptoms of the patient. .    Rubella 2.12 index    Comment:     Index            Interpretation     -----            --------------       <0.90            Not consistent with Immunity     0.90-0.99        Equivocal     > or = 1.00      Consistent with Immunity  . The presence of rubella IgG antibody suggests  immunization or past or current infection with rubella virus.   B. burgdorfi antibodies     Status: None   Collection Time: 08/05/18 12:02 PM  Result Value Ref Range   B burgdorferi Ab IgG+IgM <0.90 index    Comment:                    Index                Interpretation                    -----                --------------                    < 0.90               Negative                    0.90-1.09            Equivocal                    > 1.09               Positive . As recommended by the Food and Drug Administration  (FDA), all samples with positive or equivocal  results in a Borrelia burgdorferi antibody  screen will be tested using a blot method. Positive or  equivocal screening test results should not be  interpreted as truly positive until verified as such  using a supplemental assay (e.g., B. burgdorferi blot). . The screening test and/or blot for B. burgdorferi  antibodies may be falsely negative in early stages of Lyme disease, including the period when erythema  migrans is apparent. .   Hemoglobin A1c     Status: None   Collection Time: 08/05/18 12:02 PM  Result Value Ref Range   Hgb A1c MFr Bld 5.1 4.6 - 6.5 %    Comment: Glycemic Control Guidelines for People with Diabetes:Non Diabetic:  <6%Goal of Therapy: <7%Additional Action Suggested:  >8%   Urinalysis, Routine w reflex microscopic     Status: Abnormal   Collection Time: 08/05/18 12:03 PM  Result Value Ref Range   Color, Urine DARK YELLOW YELLOW   APPearance CLOUDY (A) CLEAR   Specific Gravity, Urine 1.027 1.001 - 1.03   pH 5.5 5.0 - 8.0   Glucose, UA NEGATIVE NEGATIVE   Bilirubin Urine NEGATIVE NEGATIVE   Ketones, ur NEGATIVE NEGATIVE   Hgb urine dipstick 3+ (A) NEGATIVE   Protein, ur 1+ (A) NEGATIVE   Nitrite NEGATIVE NEGATIVE   Leukocytes, UA NEGATIVE NEGATIVE  WBC, UA 0-5 0 - 5 /HPF   RBC / HPF > OR = 60 (A) 0 - 2 /HPF   Squamous Epithelial / LPF NONE SEEN < OR = 5 /HPF   Bacteria, UA NONE SEEN NONE SEEN /HPF   Hyaline Cast NONE SEEN NONE SEEN /LPF  Urine Culture     Status: None   Collection Time: 08/05/18 12:19 PM  Result Value Ref Range   MICRO NUMBER: [id number]    SPECIMEN QUALITY: Adequate    Sample Source NOT GIVEN    STATUS: FINAL    Result: No Growth   IgA     Status: None   Collection Time: 08/21/18 10:27 AM  Result Value Ref Range   IgA 347 68 - 378 mg/dL  Tissue transglutaminase, IgA     Status: None   Collection Time: 08/21/18 10:27 AM  Result Value Ref Range   (tTG) Ab, IgA 1 U/mL    Comment: .        Value      Interpretation        -----      --------------        <4         No  Antibody Detected        > or = 4   Antibody Detected .    Objective  Body mass index is 22.09 kg/m. Wt Readings from Last 3 Encounters:  09/04/18 149 lb 9.6 oz (67.9 kg)  09/01/18 146 lb (66.2 kg)  08/21/18 149 lb (67.6 kg)   Temp Readings from Last 3 Encounters:  09/04/18 98.3 F (36.8 C) (Oral)  09/01/18 (!) 96.8 F (36 C) (Temporal)  08/05/18 98.1 F (36.7 C) (Oral)   BP Readings from Last 3 Encounters:  09/04/18 (!) 144/112  09/01/18 (!) 155/99  08/21/18 114/80   Pulse Readings from Last 3 Encounters:  09/04/18 72  09/01/18 (!) 53  08/21/18 78    Physical Exam Vitals signs and nursing note reviewed.  Constitutional:      Appearance: Normal appearance. He is well-developed and well-groomed.  HENT:     Head: Normocephalic and atraumatic.     Nose: Nose normal.     Mouth/Throat:     Mouth: Mucous membranes are moist.     Pharynx: Oropharynx is clear.  Eyes:     Conjunctiva/sclera: Conjunctivae normal.     Pupils: Pupils are equal, round, and reactive to light.  Cardiovascular:     Rate and Rhythm: Normal rate and regular rhythm.     Heart sounds: Normal heart sounds.  Pulmonary:     Effort: Pulmonary effort is normal.     Breath sounds: Normal breath sounds.  Skin:    General: Skin is warm and dry.  Neurological:     General: No focal deficit present.     Mental Status: He is alert and oriented to person, place, and time. Mental status is at baseline.     Gait: Gait normal.  Psychiatric:        Attention and Perception: Attention and perception normal.        Mood and Affect: Mood and affect normal.        Speech: Speech normal.        Behavior: Behavior normal. Behavior is cooperative.        Thought Content: Thought content normal.        Cognition and Memory: Cognition and memory normal.        Judgment:  Judgment normal.     Assessment   1. 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 HTN  3. Right testicular pain and hematuria with [place] + testicle   4. HM Plan   1. Valtrex  Appt neurology 10/19/2018  2. benicar-hct 20-12.5 mg qd  3. Refer urology  4.  Never had flu shot  ? Date on Tdap  Disc shingirix in future  Consider pna 23 in future  Will rec hep B vx in future and MMR vx  HCV negative HIV negative 08/05/2018   Labs utd CMET, CBC, lipid, TSH, PSA, UA  With 3+ to trace blood in urine As of 07/2018 and 08/2018  Smoker 1 ppd since age 21 y.o no FH lung cancer, rec smoking cessation  PSA normal 08/05/2018 1.03 will need DRE in future Colonoscopy/EGD referred [facility] in [place] h/o ulcerations in mouth ? HSV + serology  -colonoscopy 09/01/18 hyperplastic polyp f/u in 10 years  Of note hematuria may need urology referred today   Provider: Dr. Olivia Mackie McLean-Scocuz[facility]

## 2018-09-04 NOTE — Progress Notes (Signed)
Pre visit review using our clinic review tool, if applicable. No additional management support is needed unless otherwise documented below in the visit note. 

## 2018-09-08 ENCOUNTER — Other Ambulatory Visit (INDEPENDENT_AMBULATORY_CARE_PROVIDER_SITE_OTHER): Payer: BLUE CROSS/BLUE SHIELD

## 2018-09-08 DIAGNOSIS — I10 Essential (primary) hypertension: Secondary | ICD-10-CM | POA: Diagnosis not present

## 2018-09-09 LAB — LIPID PANEL
Cholesterol: 167 mg/dL (ref 0–200)
HDL: 64.5 mg/dL (ref >39.00)
LDL CALC: 85 mg/dL (ref 0–99)
NonHDL: 102.65
Total CHOL/HDL Ratio: 3
Triglycerides: 88 mg/dL (ref 0.0–149.0)
VLDL: 17.6 mg/dL (ref 0.0–40.0)

## 2018-09-09 LAB — BASIC METABOLIC PANEL
BUN: 12 mg/dL (ref 6–23)
CO2: 28 mEq/L (ref 19–32)
Calcium: 9.8 mg/dL (ref 8.4–10.5)
Chloride: 103 mEq/L (ref 96–112)
Creatinine, Ser: 1.15 mg/dL (ref 0.40–1.50)
GFR: 80.54 mL/min (ref >60.00)
Glucose, Bld: 72 mg/dL (ref 70–99)
Potassium: 3.9 mEq/L (ref 3.5–5.1)
Sodium: 138 mEq/L (ref 135–145)

## 2018-09-18 ENCOUNTER — Telehealth: Payer: Self-pay | Admitting: Internal Medicine

## 2018-09-18 NOTE — Telephone Encounter (Signed)
Copied from [place] [id number]. Topic: Quick Communication - See Telephone Encounter >> Sep 18, 2018  1:22 PM Blase Mess A wrote: CRM for notification. See Telephone encounter for: 09/18/18.  Patient is calling the pharmacy does not have his BP medication  olmesartan-hydrochlorothiazide (BENICAR HCT) 20-12.5 MG tablet [[id number]] Requesting the medication to be changed. Please advise.  [phone]

## 2018-09-29 ENCOUNTER — Ambulatory Visit: Payer: BLUE CROSS/BLUE SHIELD | Admitting: Neurology

## 2018-10-19 ENCOUNTER — Ambulatory Visit: Payer: BLUE CROSS/BLUE SHIELD | Admitting: Neurology

## 2018-10-19 ENCOUNTER — Ambulatory Visit: Payer: Self-pay | Admitting: Urology

## 2018-11-19 ENCOUNTER — Ambulatory Visit: Payer: Self-pay | Admitting: Urology

## 2018-12-03 ENCOUNTER — Other Ambulatory Visit: Payer: Self-pay

## 2018-12-03 ENCOUNTER — Encounter: Payer: Self-pay | Admitting: Internal Medicine

## 2018-12-03 ENCOUNTER — Ambulatory Visit (INDEPENDENT_AMBULATORY_CARE_PROVIDER_SITE_OTHER): Payer: BLUE CROSS/BLUE SHIELD | Admitting: Internal Medicine

## 2018-12-03 ENCOUNTER — Ambulatory Visit: Payer: BLUE CROSS/BLUE SHIELD | Admitting: Internal Medicine

## 2018-12-03 DIAGNOSIS — N503 Cyst of epididymis: Secondary | ICD-10-CM | POA: Diagnosis not present

## 2018-12-03 DIAGNOSIS — R319 Hematuria, unspecified: Secondary | ICD-10-CM

## 2018-12-03 DIAGNOSIS — G510 Bell's palsy: Secondary | ICD-10-CM

## 2018-12-03 DIAGNOSIS — B009 Herpesviral infection, unspecified: Secondary | ICD-10-CM

## 2018-12-03 DIAGNOSIS — I10 Essential (primary) hypertension: Secondary | ICD-10-CM

## 2018-12-03 DIAGNOSIS — E559 Vitamin D deficiency, unspecified: Secondary | ICD-10-CM

## 2018-12-03 NOTE — Progress Notes (Signed)
Virtual Visit via Video Note  I connected with Walter Huffman  on 12/03/18 at  4:10 PM EDT by a video enabled telemedicine application and verified that I am speaking with the correct person using two identifiers.  Location patient: car Location provider:work  Persons participating in the virtual visit: patient, provider  I discussed the limitations of evaluation and management by telemedicine and the availability of in person appointments. The patient expressed understanding and agreed to proceed.   HPI: 1. HTN checking with wrist cuff and ave 140/80 he had to stop benicar hct 20-12.5 it made him feel dizzy/lightheaded the 1st week  2. 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 Bells palsy/HSV no issues for now not taking Valtrex   ROS: See pertinent positives and negatives per HPI.  Past Medical History:  Diagnosis Date  . Allergy   . Bell's palsy   . Bell's palsy    recurrent x 3 x over the last 20 years as of 53/22/2020   . Chicken pox   . HSV infection   . Vitamin D deficiency     Past Surgical History:  Procedure Laterality Date  . COLONOSCOPY    . DENTAL RESTORATION/EXTRACTION WITH X-RAY     had teeth removed  . HERNIA REPAIR    . I&D EXTREMITY Left 01/11/2016   Procedure: IRRIGATION AND DEBRIDEMENT OF WRIST;  Surgeon: Iran Planas, MD;  Location: [place];  Service: Orthopedics;  Laterality: Left;    Family History  Problem Relation Age of Onset  . Diabetes Mother   . Hypertension Mother   . Throat cancer Father   . Throat cancer Paternal Uncle   . Stomach cancer Neg Hx   . Colon cancer Neg Hx   . Pancreatic cancer Neg Hx     SOCIAL HX: married, smoking cigarettes   Current Outpatient Medications:  .  Cholecalciferol 1.25 MG (50000 UT) capsule, Take 1 capsule (50,000 Units total) by mouth once a week., Disp: 13 capsule, Rfl: 1 .  valACYclovir (VALTREX) 1000 MG  tablet, Take 1 tablet (1,000 mg total) by mouth 3 (three) times daily. X 7-10 days as needed Bells palsy or herpes, Disp: 90 tablet, Rfl: 3  EXAM:  VITALS per patient if applicable:  GENERAL: alert, oriented, appears well and in no acute distress  HEENT: atraumatic, conjunttiva clear, no obvious abnormalities on inspection of external nose and ears  NECK: normal movements of the head and neck  LUNGS: on inspection no signs of respiratory distress, breathing rate appears normal, no obvious gross SOB, gasping or wheezing  CV: no obvious cyanosis  MS: moves all visible extremities without noticeable abnormality  PSYCH/NEURO: pleasant and cooperative, no obvious depression or anxiety, speech and thought processing grossly intact  ASSESSMENT AND PLAN:  Discussed the following assessment and plan:  Vitamin D deficiency -rec take D3 50K x 3 months then will need to take 2000 to 5000 IU daily   Hematuria, unspecified type Epididymal cyst -f/u with urology will give pt the phone # -[place] 08/17/2018  Question mildly complicated epididymal cyst cyst versus spermatocele within the RIGHT epididymal head 6 mm greatest diameter.  Essential hypertension 140/80 averaging had to stop benicar hct 20-12.5   Bell's palsy likely 2/2 HSV infection prn Valtrex   HM Never had flu shot  ? Date on Tdap  Disc shingirix in future  Consider pna 23 in future  Will  rec hep B vx in future and MMR vx  53V negative HIV negative 08/05/2018   Labs utd CMET, CBC, lipid, TSH, PSA, UA  With 3+ to trace blood in urine As of 53/2020 and 08/2018  Smoker 1 ppd since age 53 y.o no FH lung cancer, rec smoking cessation  PSA normal 53/22/2020 1.03 will need DRE in future Colonoscopy/EGD referred [facility] in [place] h/o ulcerations in mouth ? HSV + serology  -colonoscopy 09/01/18 hyperplastic polyp f/u in 10 years  Of note hematuria urology referral pending will see after COVID 19     I discussed the assessment and  treatment plan with the patient. The patient was provided an opportunity to ask questions and all were answered. The patient agreed with the plan and demonstrated an understanding of the instructions.   The patient was advised to call back or seek an in-person evaluation if the symptoms worsen or if the condition fails to improve as anticipated.  Time spent 15 minutes  Delorise Jackson, MD

## 2018-12-03 NOTE — Progress Notes (Signed)
benicar made him feel light headed and dizzy pt stopped the medication of the first week.

## 2019-09-07 ENCOUNTER — Ambulatory Visit (INDEPENDENT_AMBULATORY_CARE_PROVIDER_SITE_OTHER): Payer: BC Managed Care – PPO | Admitting: Internal Medicine

## 2019-09-07 ENCOUNTER — Encounter: Payer: Self-pay | Admitting: Internal Medicine

## 2019-09-07 ENCOUNTER — Other Ambulatory Visit: Payer: Self-pay

## 2019-09-07 ENCOUNTER — Telehealth: Payer: Self-pay

## 2019-09-07 VITALS — BP 125/80 | Ht 69.0 in | Wt 140.0 lb

## 2019-09-07 DIAGNOSIS — K219 Gastro-esophageal reflux disease without esophagitis: Secondary | ICD-10-CM | POA: Insufficient documentation

## 2019-09-07 DIAGNOSIS — I10 Essential (primary) hypertension: Secondary | ICD-10-CM | POA: Diagnosis not present

## 2019-09-07 DIAGNOSIS — B009 Herpesviral infection, unspecified: Secondary | ICD-10-CM

## 2019-09-07 DIAGNOSIS — R319 Hematuria, unspecified: Secondary | ICD-10-CM

## 2019-09-07 DIAGNOSIS — R1030 Lower abdominal pain, unspecified: Secondary | ICD-10-CM

## 2019-09-07 DIAGNOSIS — Z1329 Encounter for screening for other suspected endocrine disorder: Secondary | ICD-10-CM

## 2019-09-07 DIAGNOSIS — Z125 Encounter for screening for malignant neoplasm of prostate: Secondary | ICD-10-CM

## 2019-09-07 DIAGNOSIS — R369 Urethral discharge, unspecified: Secondary | ICD-10-CM

## 2019-09-07 DIAGNOSIS — E559 Vitamin D deficiency, unspecified: Secondary | ICD-10-CM

## 2019-09-07 DIAGNOSIS — G510 Bell's palsy: Secondary | ICD-10-CM | POA: Diagnosis not present

## 2019-09-07 DIAGNOSIS — N503 Cyst of epididymis: Secondary | ICD-10-CM

## 2019-09-07 MED ORDER — OLMESARTAN MEDOXOMIL-HCTZ 20-12.5 MG PO TABS: 1.0000 | ORAL_TABLET | Freq: Every day | ORAL | 3 refills | Status: DC

## 2019-09-07 MED ORDER — PANTOPRAZOLE SODIUM 40 MG PO TBEC: 40.0000 mg | DELAYED_RELEASE_TABLET | Freq: Every day | ORAL | 3 refills | Status: AC

## 2019-09-07 MED ORDER — VALACYCLOVIR HCL 1 G PO TABS: 1000.0000 mg | ORAL_TABLET | Freq: Three times a day (TID) | ORAL | 3 refills | Status: AC

## 2019-09-07 NOTE — Telephone Encounter (Signed)
Patient scheduled 09/13/19 at 9:45am

## 2019-09-07 NOTE — Patient Instructions (Addendum)
Pantoprazole tablets What is this medicine? PANTOPRAZOLE (pan TOE pra zole) prevents the production of acid in the stomach. It is used to treat gastroesophageal reflux disease (GERD), inflammation of the esophagus, and Zollinger-Ellison syndrome. This medicine may be used for other purposes; ask your health care provider or pharmacist if you have questions. COMMON BRAND NAME(S): Protonix What should I tell my health care provider before I take this medicine? They need to know if you have any of these conditions:  liver disease  low levels of magnesium in the blood  lupus  an unusual or allergic reaction to omeprazole, lansoprazole, pantoprazole, rabeprazole, other medicines, foods, dyes, or preservatives  pregnant or trying to get pregnant  breast-feeding How should I use this medicine? Take this medicine by mouth. Swallow the tablets whole with a drink of water. Follow the directions on the prescription label. Do not crush, break, or chew. Take your medicine at regular intervals. Do not take your medicine more often than directed. Talk to your pediatrician regarding the use of this medicine in children. While this drug may be prescribed for children as young as 5 years for selected conditions, precautions do apply. Overdosage: If you think you have taken too much of this medicine contact a poison control center or emergency room at once. NOTE: This medicine is only for you. Do not share this medicine with others. What if I miss a dose? If you miss a dose, take it as soon as you can. If it is almost time for your next dose, take only that dose. Do not take double or extra doses. What may interact with this medicine? Do not take this medicine with any of the following medications:  atazanavir  nelfinavir This medicine may also interact with the following medications:  ampicillin  delavirdine  erlotinib  iron salts  medicines for fungal infections like ketoconazole,  itraconazole and voriconazole  methotrexate  mycophenolate mofetil  warfarin This list may not describe all possible interactions. Give your health care provider a list of all the medicines, herbs, non-prescription drugs, or dietary supplements you use. Also tell them if you smoke, drink alcohol, or use illegal drugs. Some items may interact with your medicine. What should I watch for while using this medicine? It can take several days before your stomach pain gets better. Check with your doctor or health care provider if your condition does not start to get better, or if it gets worse. This medicine may cause serious skin reactions. They can happen weeks to months after starting the medicine. Contact your health care provider right away if you notice fevers or flu-like symptoms with a rash. The rash may be red or purple and then turn into blisters or peeling of the skin. Or, you might notice a red rash with swelling of the face, lips or lymph nodes in your neck or under your arms. You may need blood work done while you are taking this medicine. This medicine may cause a decrease in vitamin B12. You should make sure that you get enough vitamin B12 while you are taking this medicine. Discuss the foods you eat and the vitamins you take with your health care provider. What side effects may I notice from receiving this medicine? Side effects that you should report to your doctor or health care professional as soon as possible:   allergic reactions like skin rash, itching or hives, swelling of the face, lips, or tongue   bone, muscle or joint pain   breathing problems  chest pain or chest tightness   dark yellow or brown urine   dizziness   fast, irregular heartbeat   feeling faint or lightheaded   fever or sore throat   muscle spasm   palpitations   rash on cheeks or arms that gets worse in the sun   redness, blistering, peeling or loosening of the skin, including  inside the mouth   seizures  stomach polyps   tremors   unusual bleeding or bruising   unusually weak or tired   yellowing of the eyes or skin Side effects that usually do not require medical attention (report to your doctor or health care professional if they continue or are bothersome):   constipation   diarrhea   dry mouth   headache   nausea This list may not describe all possible side effects. Call your doctor for medical advice about side effects. You may report side effects to FDA at 1-800-FDA-1088. Where should I keep my medicine? Keep out of the reach of children. Store at room temperature between 15 and 30 degrees C (59 and 86 degrees F). Protect from light and moisture. Throw away any unused medicine after the expiration date. NOTE: This sheet is a summary. It may not cover all possible information. If you have questions about this medicine, talk to your doctor, pharmacist, or health care provider.  2020 Elsevier/Gold Standard (2018-10-09 13:18:32)  Gastroesophageal Reflux Disease, Adult Gastroesophageal reflux (GER) happens when acid from the stomach flows up into the tube that connects the mouth and the stomach (esophagus). Normally, food travels down the esophagus and stays in the stomach to be digested. However, when a person has GER, food and stomach acid sometimes move back up into the esophagus. If this becomes a more serious problem, the person may be diagnosed with a disease called gastroesophageal reflux disease (GERD). GERD occurs when the reflux:  Happens often.  Causes frequent or severe symptoms.  Causes problems such as damage to the esophagus. When stomach acid comes in contact with the esophagus, the acid may cause soreness (inflammation) in the esophagus. Over time, GERD may create small holes (ulcers) in the lining of the esophagus. What are the causes? This condition is caused by a problem with the muscle between the esophagus and the  stomach (lower esophageal sphincter, or LES). Normally, the LES muscle closes after food passes through the esophagus to the stomach. When the LES is weakened or abnormal, it does not close properly, and that allows food and stomach acid to go back up into the esophagus. The LES can be weakened by certain dietary substances, medicines, and medical conditions, including:  Tobacco use.  Pregnancy.  Having a hiatal hernia.  Alcohol use.  Certain foods and beverages, such as coffee, chocolate, onions, and peppermint. What increases the risk? You are more likely to develop this condition if you:  Have an increased body weight.  Have a connective tissue disorder.  Use NSAID medicines. What are the signs or symptoms? Symptoms of this condition include:  Heartburn.  Difficult or painful swallowing.  The feeling of having a lump in the throat.  Abitter taste in the mouth.  Bad breath.  Having a large amount of saliva.  Having an upset or bloated stomach.  Belching.  Chest pain. Different conditions can cause chest pain. Make sure you see your health care provider if you experience chest pain.  Shortness of breath or wheezing.  Ongoing (chronic) cough or a night-time cough.  Wearing away of  tooth enamel.  Weight loss. How is this diagnosed? Your health care provider will take a medical history and perform a physical exam. To determine if you have mild or severe GERD, your health care provider may also monitor how you respond to treatment. You may also have tests, including:  A test to examine your stomach and esophagus with a small camera (endoscopy).  A test thatmeasures the acidity level in your esophagus.  A test thatmeasures how much pressure is on your esophagus.  A barium swallow or modified barium swallow test to show the shape, size, and functioning of your esophagus. How is this treated? The goal of treatment is to help relieve your symptoms and to prevent  complications. Treatment for this condition may vary depending on how severe your symptoms are. Your health care provider may recommend:  Changes to your diet.  Medicine.  Surgery. Follow these instructions at home: Eating and drinking   Follow a diet as recommended by your health care provider. This may involve avoiding foods and drinks such as: ? Coffee and tea (with or without caffeine). ? Drinks that containalcohol. ? Energy drinks and sports drinks. ? Carbonated drinks or sodas. ? Chocolate and cocoa. ? Peppermint and mint flavorings. ? Garlic and onions. ? Horseradish. ? Spicy and acidic foods, including peppers, chili powder, curry powder, vinegar, hot sauces, and barbecue sauce. ? Citrus fruit juices and citrus fruits, such as oranges, lemons, and limes. ? Tomato-based foods, such as red sauce, chili, salsa, and pizza with red sauce. ? Fried and fatty foods, such as donuts, french fries, potato chips, and high-fat dressings. ? High-fat meats, such as hot dogs and fatty cuts of red and white meats, such as rib eye steak, sausage, ham, and bacon. ? High-fat dairy items, such as whole milk, butter, and cream cheese.  Eat small, frequent meals instead of large meals.  Avoid drinking large amounts of liquid with your meals.  Avoid eating meals during the 2-3 hours before bedtime.  Avoid lying down right after you eat.  Do not exercise right after you eat. Lifestyle   Do not use any products that contain nicotine or tobacco, such as cigarettes, e-cigarettes, and chewing tobacco. If you need help quitting, ask your health care provider.  Try to reduce your stress by using methods such as yoga or meditation. If you need help reducing stress, ask your health care provider.  If you are overweight, reduce your weight to an amount that is healthy for you. Ask your health care provider for guidance about a safe weight loss goal. General instructions  Pay attention to any  changes in your symptoms.  Take over-the-counter and prescription medicines only as told by your health care provider. Do not take aspirin, ibuprofen, or other NSAIDs unless your health care provider told you to do so.  Wear loose-fitting clothing. Do not wear anything tight around your waist that causes pressure on your abdomen.  Raise (elevate) the head of your bed about 6 inches (15 cm).  Avoid bending over if this makes your symptoms worse.  Keep all follow-up visits as told by your health care provider. This is important. Contact a health care provider if:  You have: ? New symptoms. ? Unexplained weight loss. ? Difficulty swallowing or it hurts to swallow. ? Wheezing or a persistent cough. ? A hoarse voice.  Your symptoms do not improve with treatment. Get help right away if you:  Have pain in your arms, neck, jaw, teeth, or  back.  Feel sweaty, dizzy, or light-headed.  Have chest pain or shortness of breath.  Vomit and your vomit looks like blood or coffee grounds.  Faint.  Have stool that is bloody or black.  Cannot swallow, drink, or eat. Summary  Gastroesophageal reflux happens when acid from the stomach flows up into the esophagus. GERD is a disease in which the reflux happens often, causes frequent or severe symptoms, or causes problems such as damage to the esophagus.  Treatment for this condition may vary depending on how severe your symptoms are. Your health care provider may recommend diet and lifestyle changes, medicine, or surgery.  Contact a health care provider if you have new or worsening symptoms.  Take over-the-counter and prescription medicines only as told by your health care provider. Do not take aspirin, ibuprofen, or other NSAIDs unless your health care provider told you to do so.  Keep all follow-up visits as told by your health care provider. This is important. This information is not intended to replace advice given to you by your health  care provider. Make sure you discuss any questions you have with your health care provider. Document Revised: 01/07/2018 Document Reviewed: 01/07/2018 Elsevier Patient Education  2020 [place] for Gastroesophageal Reflux Disease, Adult When you have gastroesophageal reflux disease (GERD), the foods you eat and your eating habits are very important. Choosing the right foods can help ease the discomfort of GERD. Consider working with a diet and nutrition specialist (dietitian) to help you make healthy food choices. What general guidelines should I follow?  Eating plan  Choose healthy foods low in fat, such as fruits, vegetables, whole grains, low-fat dairy products, and lean meat, fish, and poultry.  Eat frequent, small meals instead of three large meals each day. Eat your meals slowly, in a relaxed setting. Avoid bending over or lying down until 2-3 hours after eating.  Limit high-fat foods such as fatty meats or fried foods.  Limit your intake of oils, butter, and shortening to less than 8 teaspoons each day.  Avoid the following: ? Foods that cause symptoms. These may be different for different people. Keep a food diary to keep track of foods that cause symptoms. ? Alcohol. ? Drinking large amounts of liquid with meals. ? Eating meals during the 2-3 hours before bed.  Cook foods using methods other than frying. This may include baking, grilling, or broiling. Lifestyle  Maintain a healthy weight. Ask your health care provider what weight is healthy for you. If you need to lose weight, work with your health care provider to do so safely.  Exercise for at least 30 minutes on 5 or more days each week, or as told by your health care provider.  Avoid wearing clothes that fit tightly around your waist and chest.  Do not use any products that contain nicotine or tobacco, such as cigarettes and e-cigarettes. If you need help quitting, ask your health care  provider.  Sleep with the head of your bed raised. Use a wedge under the mattress or blocks under the bed frame to raise the head of the bed. What foods are not recommended? The items listed may not be a complete list. Talk with your dietitian about what dietary choices are best for you. Grains Pastries or quick breads with added fat. [place] toast. Vegetables Deep fried vegetables. [place] fries. Any vegetables prepared with added fat. Any vegetables that cause symptoms. For some people this may include tomatoes and tomato products,  chili peppers, onions and garlic, and horseradish. Fruits Any fruits prepared with added fat. Any fruits that cause symptoms. For some people this may include citrus fruits, such as oranges, grapefruit, pineapple, and lemons. Meats and other protein foods High-fat meats, such as fatty beef or pork, hot dogs, ribs, ham, sausage, salami and bacon. Fried meat or protein, including fried fish and fried chicken. Nuts and nut butters. Dairy Whole milk and chocolate milk. Sour cream. Cream. Ice cream. Cream cheese. Milk shakes. Beverages Coffee and tea, with or without caffeine. Carbonated beverages. Sodas. Energy drinks. Fruit juice made with acidic fruits (such as orange or grapefruit). Tomato juice. Alcoholic drinks. Fats and oils Butter. Margarine. Shortening. Ghee. Sweets and desserts Chocolate and cocoa. Donuts. Seasoning and other foods Pepper. Peppermint and spearmint. Any condiments, herbs, or seasonings that cause symptoms. For some people, this may include curry, hot sauce, or vinegar-based salad dressings. Summary  When you have gastroesophageal reflux disease (GERD), food and lifestyle choices are very important to help ease the discomfort of GERD.  Eat frequent, small meals instead of three large meals each day. Eat your meals slowly, in a relaxed setting. Avoid bending over or lying down until 2-3 hours after eating.  Limit high-fat foods such as fatty  meat or fried foods. This information is not intended to replace advice given to you by your health care provider. Make sure you discuss any questions you have with your health care provider. Document Revised: 10/22/2018 Document Reviewed: 07/02/2016 Elsevier Patient Education  [place].   COVID-19 Vaccine Information can be found at: ShippingScam.co.uk For questions related to vaccine distribution or appointments, please email [email or web address] or call [phone].

## 2019-09-07 NOTE — Telephone Encounter (Signed)
-----   Message from Delorise Jackson, MD sent at 09/07/2019  4:33 PM EST ----- Fasting labs asap

## 2019-09-07 NOTE — Progress Notes (Signed)
telephone Note  I connected with Walter Huffman  on 09/07/19 at  4:20 PM EST by telephone and verified that I am speaking with the correct person using two identifiers.  Location patient: home Location provider:work or home office Persons participating in the virtual visit: patient, provider  I discussed the limitations of evaluation and management by telemedicine and the availability of in person appointments. The patient expressed understanding and agreed to proceed.   HPI: 1. HTN on benicar 20-12.5 mg qd needs refills controlled 2. GERD c/o worse qhs nothing tried he does drink tea at night and we disc this could be trigger  3. HSV needs refills of valtrex  4. Hematuria last urine culture negative did not f/u with urology wants to wait until after labs   ROS: See pertinent positives and negatives per HPI.  Past Medical History:  Diagnosis Date  . Allergy   . Bell's palsy   . Bell's palsy    recurrent x 3 x over the last 20 years as of 08/05/2018   . Chicken pox   . HSV infection   . Vitamin D deficiency     Past Surgical History:  Procedure Laterality Date  . COLONOSCOPY    . DENTAL RESTORATION/EXTRACTION WITH X-RAY     had teeth removed  . HERNIA REPAIR    . I & D EXTREMITY Left 01/11/2016   Procedure: IRRIGATION AND DEBRIDEMENT OF WRIST;  Surgeon: Iran Planas, MD;  Location: [facility];  Service: Orthopedics;  Laterality: Left;    Family History  Problem Relation Age of Onset  . Diabetes Mother   . Hypertension Mother   . Throat cancer Father   . Throat cancer Paternal Uncle   . Stomach cancer Neg Hx   . Colon cancer Neg Hx   . Pancreatic cancer Neg Hx     SOCIAL HX:  2 kids age 46 and 83 as of 08/05/2018  Married  12th grade ed [employer]  Wears seat belt, safe in relationship    Current Outpatient Medications:  .  olmesartan-hydrochlorothiazide (BENICAR HCT) 20-12.5 MG tablet, Take 1 tablet by mouth daily. In am, Disp: 90 tablet, Rfl: 3 .  Cholecalciferol  1.25 MG (50000 UT) capsule, Take 1 capsule (50,000 Units total) by mouth once a week. (Patient not taking: Reported on 09/07/2019), Disp: 13 capsule, Rfl: 1 .  pantoprazole (PROTONIX) 40 MG tablet, Take 1 tablet (40 mg total) by mouth daily. In pm 30 minutes before dinner, Disp: 90 tablet, Rfl: 3 .  valACYclovir (VALTREX) 1000 MG tablet, Take 1 tablet (1,000 mg total) by mouth 3 (three) times daily. X 7-10 days as needed Bells palsy or herpes, Disp: 90 tablet, Rfl: 3  EXAM:  VITALS per patient if applicable:  GENERAL: alert, oriented, appears well and in no acute distress  PSYCH/NEURO: pleasant and cooperative, no obvious depression or anxiety, speech and thought processing grossly intact  ASSESSMENT AND PLAN:  Discussed the following assessment and plan:  Essential hypertension controlled- Plan: olmesartan-hydrochlorothiazide (BENICAR HCT) 20-12.5 MG tablet, Lipid panel, Comprehensive metabolic panel, CBC w/Diff, Urinalysis, Routine w reflex microscopic Cont to monitor BP  Bell's palsy - Plan: valACYclovir (VALTREX) 1000 MG tablet prn  HSV-2 infection - Plan: valACYclovir (VALTREX) 1000 MG tablet prn  Gastroesophageal reflux disease without esophagitis - Plan: pantoprazole (PROTONIX) 40 MG tablet to try given food list  Hematuria, unspecified type - Plan: Urinalysis, Routine w reflex microscopic Hold urology for now consider in future with h/o hematuria   HM Never  had flu shot  ? Date on Tdap  Disc shingirix in future  covid 19 vx unsure given info to check out website  Consider pna 23 in future with h/o smoking  rec hep B vx in future and MMR vx HCV negative HIV negative 08/05/2018   Smoker 1 ppd since age 25 y.o no FH lung cancer, rec smoking cessation  PSAnormal 1/22/20201.03will need DRE in future Colonoscopy/EGD referred [facility] in [place] h/o ulcerations in mouth ? HSV + serology -colonoscopy 09/01/18 hyperplastic polypf/u in 10 years Of note hematuria urology referral  did not go in 08/2018 due to covid 19 see above  -we discussed possible serious and likely etiologies, options for evaluation and workup, limitations of telemedicine visit vs in person visit, treatment, treatment risks and precautions. Pt prefers to treat via telemedicine empirically rather then risking or undertaking an in person visit at this moment. Patient agrees to seek prompt in person care if worsening, new symptoms arise, or if is not improving with treatment.   I discussed the assessment and treatment plan with the patient. The patient was provided an opportunity to ask questions and all were answered. The patient agreed with the plan and demonstrated an understanding of the instructions.   The patient was advised to call back or seek an in-person evaluation if the symptoms worsen or if the condition fails to improve as anticipated.  Time spent 20 minutes Delorise Jackson, MD

## 2019-09-13 ENCOUNTER — Other Ambulatory Visit: Payer: Self-pay | Admitting: Internal Medicine

## 2019-09-13 ENCOUNTER — Other Ambulatory Visit: Payer: Self-pay

## 2019-09-13 ENCOUNTER — Other Ambulatory Visit (INDEPENDENT_AMBULATORY_CARE_PROVIDER_SITE_OTHER): Payer: BC Managed Care – PPO

## 2019-09-13 DIAGNOSIS — I10 Essential (primary) hypertension: Secondary | ICD-10-CM

## 2019-09-13 DIAGNOSIS — R319 Hematuria, unspecified: Secondary | ICD-10-CM

## 2019-09-13 DIAGNOSIS — E559 Vitamin D deficiency, unspecified: Secondary | ICD-10-CM

## 2019-09-13 DIAGNOSIS — Z1329 Encounter for screening for other suspected endocrine disorder: Secondary | ICD-10-CM

## 2019-09-13 DIAGNOSIS — Z125 Encounter for screening for malignant neoplasm of prostate: Secondary | ICD-10-CM

## 2019-09-13 LAB — COMPREHENSIVE METABOLIC PANEL
ALT: 21 U/L (ref 0–53)
AST: 18 U/L (ref 0–37)
Albumin: 3.8 g/dL (ref 3.5–5.2)
Alkaline Phosphatase: 92 U/L (ref 39–117)
BUN: 11 mg/dL (ref 6–23)
CO2: 28 mEq/L (ref 19–32)
Calcium: 10.1 mg/dL (ref 8.4–10.5)
Chloride: 103 mEq/L (ref 96–112)
Creatinine, Ser: 1.16 mg/dL (ref 0.40–1.50)
GFR: 79.43 mL/min (ref >60.00)
Glucose, Bld: 75 mg/dL (ref 70–99)
Potassium: 4.4 mEq/L (ref 3.5–5.1)
Sodium: 138 mEq/L (ref 135–145)
Total Bilirubin: 0.5 mg/dL (ref 0.2–1.2)
Total Protein: 7.2 g/dL (ref 6.0–8.3)

## 2019-09-13 LAB — LIPID PANEL
Cholesterol: 162 mg/dL (ref 0–200)
HDL: 59.4 mg/dL (ref >39.00)
LDL Cholesterol: 79 mg/dL (ref 0–99)
NonHDL: 102.38
Total CHOL/HDL Ratio: 3
Triglycerides: 117 mg/dL (ref 0.0–149.0)
VLDL: 23.4 mg/dL (ref 0.0–40.0)

## 2019-09-13 LAB — CBC WITH DIFFERENTIAL/PLATELET
Basophils Absolute: 0.1 10*3/uL (ref 0.0–0.1)
Basophils Relative: 1.3 % (ref 0.0–3.0)
Eosinophils Absolute: 0.3 10*3/uL (ref 0.0–0.7)
Eosinophils Relative: 5.5 % — ABNORMAL HIGH (ref 0.0–5.0)
HCT: 45 % (ref 39.0–52.0)
Hemoglobin: 15.2 g/dL (ref 13.0–17.0)
Lymphocytes Relative: 32.7 % (ref 12.0–46.0)
Lymphs Abs: 2 10*3/uL (ref 0.7–4.0)
MCHC: 33.9 g/dL (ref 30.0–36.0)
MCV: 95.8 fl (ref 78.0–100.0)
Monocytes Absolute: 0.6 10*3/uL (ref 0.1–1.0)
Monocytes Relative: 10 % (ref 3.0–12.0)
Neutro Abs: 3.1 10*3/uL (ref 1.4–7.7)
Neutrophils Relative %: 50.5 % (ref 43.0–77.0)
Platelets: 272 10*3/uL (ref 150.0–400.0)
RBC: 4.69 Mil/uL (ref 4.22–5.81)
RDW: 14 % (ref 11.5–15.5)
WBC: 6.2 10*3/uL (ref 4.0–10.5)

## 2019-09-13 LAB — TSH: TSH: 0.9 u[IU]/mL (ref 0.35–4.50)

## 2019-09-13 LAB — VITAMIN D 25 HYDROXY (VIT D DEFICIENCY, FRACTURES): VITD: 18.69 ng/mL — ABNORMAL LOW (ref 30.00–100.00)

## 2019-09-13 LAB — PSA: PSA: 1.39 ng/mL (ref 0.10–4.00)

## 2019-09-13 MED ORDER — CHOLECALCIFEROL 1.25 MG (50000 UT) PO CAPS: 50000.0000 [IU] | ORAL_CAPSULE | ORAL | 1 refills | Status: AC

## 2019-09-14 LAB — URINALYSIS, ROUTINE W REFLEX MICROSCOPIC
Bacteria, UA: NONE SEEN /HPF
Bilirubin Urine: NEGATIVE
Glucose, UA: NEGATIVE
Hgb urine dipstick: NEGATIVE
Hyaline Cast: NONE SEEN /LPF
Ketones, ur: NEGATIVE
Nitrite: NEGATIVE
Protein, ur: NEGATIVE
Specific Gravity, Urine: 1.019 (ref 1.001–1.03)
pH: 7 (ref 5.0–8.0)

## 2019-10-04 ENCOUNTER — Telehealth: Payer: Self-pay | Admitting: Internal Medicine

## 2019-10-04 ENCOUNTER — Encounter: Payer: Self-pay | Admitting: Internal Medicine

## 2019-10-04 NOTE — Telephone Encounter (Signed)
Pt wants a referral sent over for urology for groin and back pain

## 2019-10-04 NOTE — Telephone Encounter (Signed)
Last note form [place]: McLean-Scocuzza, Nino Glow, MD  08/17/2018  7:15 PM EST  He could have a cyst, complex in right testicle  Does he want to see urology? About testicle pain?    [place]

## 2019-10-04 NOTE — Telephone Encounter (Signed)
Additional message from patient:  Muaz, [place] to McLean-Scocuzza, Nino Glow, MD     10/04/19 3:49 PM I have had pain from groin starting last week. The pain is now also in my lower back. Having problems sitting and walking. I noticed a milky discharge when I had a bowel movement.  Can you refer me to urologist asap please?

## 2019-10-04 NOTE — Telephone Encounter (Signed)
Message added to telephone encounter from today 10/04/19

## 2019-10-05 NOTE — Telephone Encounter (Signed)
Pt called following up on this °

## 2019-10-06 ENCOUNTER — Other Ambulatory Visit: Payer: Self-pay | Admitting: Internal Medicine

## 2019-10-06 NOTE — Telephone Encounter (Signed)
Referred [facility] in [place]  Any new issues needs an appt  -->If he needs appt please sch here or rec go to urgent care  Milky discharge was from stool or penis?

## 2019-10-06 NOTE — Addendum Note (Signed)
Addended by: Orland Mustard on: 10/06/2019 08:42 PM   Modules accepted: Orders

## 2019-10-07 ENCOUNTER — Ambulatory Visit
Admission: EM | Admit: 2019-10-07 | Discharge: 2019-10-07 | Disposition: A | Discharge status: Home / Self Care | Payer: BC Managed Care – PPO | Attending: Emergency Medicine | Admitting: Emergency Medicine

## 2019-10-07 ENCOUNTER — Encounter: Payer: Self-pay | Admitting: Emergency Medicine

## 2019-10-07 ENCOUNTER — Other Ambulatory Visit: Payer: Self-pay

## 2019-10-07 DIAGNOSIS — Z202 Contact with and (suspected) exposure to infections with a predominantly sexual mode of transmission: Secondary | ICD-10-CM | POA: Diagnosis not present

## 2019-10-07 DIAGNOSIS — R369 Urethral discharge, unspecified: Secondary | ICD-10-CM | POA: Diagnosis not present

## 2019-10-07 HISTORY — DX: Essential (primary) hypertension: I10

## 2019-10-07 LAB — POCT URINALYSIS DIP (MANUAL ENTRY)
Bilirubin, UA: NEGATIVE
Glucose, UA: NEGATIVE mg/dL
Ketones, POC UA: NEGATIVE mg/dL
Leukocytes, UA: NEGATIVE
Nitrite, UA: NEGATIVE
Spec Grav, UA: >=1.03 — AB (ref 1.010–1.025)
Urobilinogen, UA: 0.2 E.U./dL
pH, UA: 5.5 (ref 5.0–8.0)

## 2019-10-07 MED ORDER — DOXYCYCLINE HYCLATE 100 MG PO CAPS: 100.0000 mg | ORAL_CAPSULE | Freq: Two times a day (BID) | ORAL | 0 refills | Status: AC

## 2019-10-07 MED ORDER — CEFTRIAXONE SODIUM 500 MG IJ SOLR
500.0000 mg | Freq: Once | INTRAMUSCULAR | Status: AC
  Administered 2019-10-07: 09:00:00 500 mg via INTRAMUSCULAR

## 2019-10-07 NOTE — Discharge Instructions (Addendum)
You were treated with an antibiotic today called Rocephin.  Additionally, take doxycycline twice a day for 7 days.    Do not have sex for 7 days.  Your STD tests are pending.  If your test results are positive, we will call you.  You may need additional treatment and your partner(s) may also need treatment.      Follow up with your primary care provider if your symptoms are not improving.

## 2019-10-07 NOTE — Telephone Encounter (Signed)
Pt informed of referral. Patient verbalized understanding

## 2019-10-07 NOTE — Telephone Encounter (Signed)
Left message to return call 

## 2019-10-07 NOTE — Telephone Encounter (Signed)
Pt called in and said he was in pain and he is going to Urgent Care across the street from [place]. He has been waiting since Monday to hear from [place]. He does want a call back.

## 2019-10-07 NOTE — ED Provider Notes (Signed)
[name]    CSN: [id number] Arrival date & time: 10/07/19  0841      History   Chief Complaint Chief Complaint  Walter Huffman presents with  . Flank Pain    right  . Groin Pain    right    HPI Walter Huffman is a 54 y.o. male.   Walter Huffman presents with 1 week history of right groin pain and episodic penile discharge.  Walter Huffman describes the penile discharge as "milky white".  Walter Huffman reports right flank pain and intermittent dysuria x3 days.  Walter Huffman denies fever, chills, rash, lesions, abdominal pain, testicular pain, or other symptoms.  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  Walter Huffman states Walter Huffman is sexually active in a monogamous marriage; Walter Huffman does not use condoms.    The history is provided by the Walter Huffman.    Past Medical History:  Diagnosis Date  . Allergy   . Bell's palsy   . Bell's palsy    recurrent x 3 x over the last 20 years as of 08/05/2018   . Chicken pox   . HSV infection    2  . Hypertension   . Vitamin D deficiency     Walter Huffman Active Problem List   Diagnosis Date Noted  . Gastroesophageal reflux disease without esophagitis 09/07/2019  . Epididymal cyst 12/03/2018  . Essential hypertension 09/04/2018  . Hematuria 08/05/2018  . Vitamin D deficiency 08/05/2018  . HSV infection 08/05/2018  . Bell's palsy 08/05/2018    Past Surgical History:  Procedure Laterality Date  . COLONOSCOPY    . DENTAL RESTORATION/EXTRACTION WITH X-RAY     had teeth removed  . HERNIA REPAIR    . I & D EXTREMITY Left 01/11/2016   Procedure: IRRIGATION AND DEBRIDEMENT OF WRIST;  Surgeon: Iran Planas, MD;  Location: [place];  Service: Orthopedics;  Laterality: Left;       Home Medications    Prior to Admission medications   Medication Sig Start Date End Date Taking? Authorizing Provider  Cholecalciferol 1.25 MG (50000 UT) capsule Take 1 capsule (50,000 Units total) by mouth once a week. 09/13/19  Yes McLean-Scocuzza, Nino Glow, MD    olmesartan-hydrochlorothiazide (BENICAR HCT) 20-12.5 MG tablet Take 1 tablet by mouth daily. In am 09/07/19  Yes McLean-Scocuzza, Nino Glow, MD  valACYclovir (VALTREX) 1000 MG tablet Take 1 tablet (1,000 mg total) by mouth 3 (three) times daily. X 7-10 days as needed Bells palsy or herpes 09/07/19  Yes McLean-Scocuzza, Nino Glow, MD  doxycycline (VIBRAMYCIN) 100 MG capsule Take 1 capsule (100 mg total) by mouth 2 (two) times daily for 7 days. 10/07/19 10/14/19  Sharion Balloon, NP  pantoprazole (PROTONIX) 40 MG tablet Take 1 tablet (40 mg total) by mouth daily. In pm 30 minutes before dinner 09/07/19   McLean-Scocuzza, Nino Glow, MD    Family History Family History  Problem Relation Age of Onset  . Diabetes Mother   . Hypertension Mother   . Throat cancer Father   . Throat cancer Paternal Uncle   . Stomach cancer Neg Hx   . Colon cancer Neg Hx   . Pancreatic cancer Neg Hx     Social History Social History   Tobacco Use  . Smoking status: Current Every Day Smoker    Packs/day: 1.00    Years: 20.00    Pack years: 20.00    Types: Cigarettes  . Smokeless tobacco: Never Used  Substance Use  Topics  . Alcohol use: No  . Drug use: No     Allergies   Walter Huffman has no known allergies.   Review of Systems Review of Systems  Constitutional: Negative for chills and fever.  HENT: Negative for ear pain and sore throat.   Eyes: Negative for pain and visual disturbance.  Respiratory: Negative for cough and shortness of breath.   Cardiovascular: Negative for chest pain and palpitations.  Gastrointestinal: Negative for abdominal pain and vomiting.  Genitourinary: Positive for discharge, dysuria and flank pain. Negative for hematuria, penile pain, penile swelling, scrotal swelling and testicular pain.  Musculoskeletal: Negative for arthralgias.  Skin: Negative for color change, rash and wound.  Neurological: Negative for seizures and syncope.  All other systems reviewed and are  negative.    Physical Exam Triage Vital Signs ED Triage Vitals  Enc Vitals Group     BP      Pulse      Resp      Temp      Temp src      SpO2      Weight      Height      Head Circumference      Peak Flow      Pain Score      Pain Loc      Pain Edu?      Excl. in [place]?    No data found.  Updated Vital Signs BP (!) 119/58 (BP Location: Left Arm)   Pulse 84   Temp 98.3 F (36.8 C) (Oral)   Resp 18   Ht 5\' 9"  (1.753 m)   Wt 140 lb (63.5 kg)   SpO2 98%   BMI 20.67 kg/m   Visual Acuity Right Eye Distance:   Left Eye Distance:   Bilateral Distance:    Right Eye Near:   Left Eye Near:    Bilateral Near:     Physical Exam Vitals and nursing note reviewed.  Constitutional:      General: Walter Huffman is not in acute distress.    Appearance: Walter Huffman is well-developed. Walter Huffman is not ill-appearing.  HENT:     Head: Normocephalic and atraumatic.     Mouth/Throat:     Mouth: Mucous membranes are moist.     Pharynx: Oropharynx is clear.  Eyes:     Conjunctiva/sclera: Conjunctivae normal.  Cardiovascular:     Rate and Rhythm: Normal rate and regular rhythm.     Heart sounds: No murmur.  Pulmonary:     Effort: Pulmonary effort is normal. No respiratory distress.     Breath sounds: Normal breath sounds.  Abdominal:     General: Bowel sounds are normal.     Palpations: Abdomen is soft.     Tenderness: There is no abdominal tenderness. There is no right CVA tenderness, left CVA tenderness, guarding or rebound.     Hernia: There is no hernia in the left inguinal area or right inguinal area.  Genitourinary:    Penis: Normal.      Testes: Normal.        Right: Tenderness not present.        Left: Tenderness not present.     Epididymis:     Right: Normal. No tenderness.     Left: Normal. No tenderness.  Musculoskeletal:     Cervical back: Neck supple.  Lymphadenopathy:     Lower Body: No right inguinal adenopathy. No left inguinal adenopathy.  Skin:    General: Skin is warm  and  dry.     Findings: No lesion or rash.  Neurological:     General: No focal deficit present.     Mental Status: Walter Huffman is alert and oriented to person, place, and time.     Gait: Gait normal.  Psychiatric:        Mood and Affect: Mood normal.        Behavior: Behavior normal.      UC Treatments / Results  Labs (all labs ordered are listed, but only abnormal results are displayed) Labs Reviewed  POCT URINALYSIS DIP (MANUAL ENTRY) - Abnormal; Notable for the following components:      Result Value   Spec Grav, UA >=1.030 (*)    Blood, UA small (*)    Protein Ur, POC trace (*)    All other components within normal limits  CYTOLOGY, (ORAL, ANAL, URETHRAL) ANCILLARY ONLY    EKG   Radiology No results found.  Procedures Procedures (including critical care time)  Medications Ordered in UC Medications  cefTRIAXone (ROCEPHIN) injection 500 mg (500 mg Intramuscular Given 10/07/19 [id number])    Initial Impression / Assessment and Plan / UC Course  I have reviewed the triage vital signs and the nursing notes.  Pertinent labs & imaging results that were available during my care of the Walter Huffman were reviewed by me and considered in my medical decision making (see chart for details).    Penile discharge, potential exposure to STD.  Urethral swab obtained for testing.  Treating with Rocephin and doxycycline.  Instructed Walter Huffman to abstain from sexual activity x7 days.  Discussed that we will call him if his STD tests are positive.  Discussed that Walter Huffman may require additional treatment and his sexual partner may require treatment also.  Instructed Walter Huffman to follow-up with his PCP if his symptoms or not improving.  Walter Huffman agrees to plan of care.     Final Clinical Impressions(s) / UC Diagnoses   Final diagnoses:  Penile discharge  Potential exposure to STD     Discharge Instructions     You were treated with an antibiotic today called Rocephin.  Additionally, take doxycycline twice a day  for 7 days.    Do not have sex for 7 days.  Your STD tests are pending.  If your test results are positive, we will call you.  You may need additional treatment and your partner(s) may also need treatment.      Follow up with your primary care provider if your symptoms are not improving.         ED Prescriptions    Medication Sig Dispense Auth. Provider   doxycycline (VIBRAMYCIN) 100 MG capsule Take 1 capsule (100 mg total) by mouth 2 (two) times daily for 7 days. 14 capsule Sharion Balloon, NP     PDMP not reviewed this encounter.   Sharion Balloon, NP 10/07/19 [phone]

## 2019-10-07 NOTE — ED Triage Notes (Signed)
Patient has taken Valtrex the last 2 days.

## 2019-10-07 NOTE — Telephone Encounter (Signed)
Pt called to let Dr. Olivia Mackie know that he went to [facility]

## 2019-10-07 NOTE — ED Triage Notes (Addendum)
Patient in today c/o right groin pain  9 days and right flank pain x 3 days. Patient denies any injury. Patient states he has had 1 episode of painful urination last week. Patient denies fever, nausea or emesis. Patient states he had a BM last week and had a mucous discharge from his penis.

## 2019-10-08 LAB — CYTOLOGY, (ORAL, ANAL, URETHRAL) ANCILLARY ONLY
Chlamydia: NEGATIVE
Neisseria Gonorrhea: NEGATIVE
Trichomonas: NEGATIVE

## 2019-10-28 DIAGNOSIS — N4341 Spermatocele of epididymis, single: Secondary | ICD-10-CM | POA: Diagnosis not present

## 2019-10-28 DIAGNOSIS — N5201 Erectile dysfunction due to arterial insufficiency: Secondary | ICD-10-CM | POA: Diagnosis not present

## 2019-10-28 DIAGNOSIS — Z125 Encounter for screening for malignant neoplasm of prostate: Secondary | ICD-10-CM | POA: Diagnosis not present

## 2019-11-18 ENCOUNTER — Ambulatory Visit: Payer: BC Managed Care – PPO | Attending: Internal Medicine

## 2019-11-18 DIAGNOSIS — Z23 Encounter for immunization: Secondary | ICD-10-CM

## 2019-11-18 NOTE — Progress Notes (Signed)
   Covid-19 Vaccination Clinic  Name:  TYLLER SARTINI    MRN: [id number] DOB: August 26, 1965  11/18/2019  Mr. Lucker was observed post Covid-19 immunization for 15 minutes without incident. He was provided with Vaccine Information Sheet and instruction to access the V-Safe system.   Mr. Setterlund was instructed to call 911 with any severe reactions post vaccine: Marland Kitchen Difficulty breathing  . Swelling of face and throat  . A fast heartbeat  . A bad rash all over body  . Dizziness and weakness

## 2019-12-16 ENCOUNTER — Ambulatory Visit: Payer: BC Managed Care – PPO | Attending: Internal Medicine

## 2019-12-16 DIAGNOSIS — Z23 Encounter for immunization: Secondary | ICD-10-CM

## 2019-12-16 NOTE — Progress Notes (Signed)
   Covid-19 Vaccination Clinic  Name:  Walter Huffman    MRN: [id number] DOB: [birth year]  12/16/2019  [name] was observed post Covid-19 immunization for 15 minutes without incident. He was provided with Vaccine Information Sheet and instruction to access the V-Safe system.   [name] was instructed to call 911 with any severe reactions post vaccine: Marland Kitchen Difficulty breathing  . Swelling of face and throat  . A fast heartbeat  . A bad rash all over body  . Dizziness and weakness   Immunizations Administered    Name Date Dose VIS Date Route   Moderna COVID-19 Vaccine 12/16/2019  2:51 PM 0.5 mL 06/2019 Intramuscular   Manufacturer: Moderna   Lot: [id number]   [id number]

## 2020-02-22 ENCOUNTER — Other Ambulatory Visit: Payer: Self-pay

## 2020-02-22 ENCOUNTER — Ambulatory Visit
Admission: EM | Admit: 2020-02-22 | Discharge: 2020-02-22 | Disposition: A | Discharge status: Home / Self Care | Payer: BC Managed Care – PPO | Attending: Emergency Medicine | Admitting: Emergency Medicine

## 2020-02-22 DIAGNOSIS — R55 Syncope and collapse: Secondary | ICD-10-CM

## 2020-02-22 DIAGNOSIS — R42 Dizziness and giddiness: Secondary | ICD-10-CM

## 2020-02-22 LAB — POCT URINALYSIS DIP (MANUAL ENTRY)
Bilirubin, UA: NEGATIVE
Blood, UA: NEGATIVE
Glucose, UA: NEGATIVE mg/dL
Ketones, POC UA: NEGATIVE mg/dL
Leukocytes, UA: NEGATIVE
Nitrite, UA: NEGATIVE
Protein Ur, POC: NEGATIVE mg/dL
Spec Grav, UA: 1.025 (ref 1.010–1.025)
Urobilinogen, UA: 0.2 E.U./dL
pH, UA: 5.5 (ref 5.0–8.0)

## 2020-02-22 NOTE — Discharge Instructions (Addendum)
Go to the emergency department if you have acute worsening symptoms.    Follow-up with your primary care provider as soon as possible.    Your lab work is pending and I will call you with the results tomorrow.  Your urine is normal.  Your EKG looks similar to the previous one from 2018.    Your Covid test is pending.  You should self quarantine until the test result is back.

## 2020-02-22 NOTE — ED Triage Notes (Signed)
Pt presents to UC for dizziness x2 days. 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 Pt states he believes he got "over heated". Pt denies symptoms at this time.   Pt describes dizziness as "seeing spots".

## 2020-02-22 NOTE — ED Provider Notes (Signed)
Walter Huffman    CSN: [id number] Arrival date & time: 02/22/20  6269      History   Chief Complaint Chief Complaint  Patient presents with   Dizziness    HPI Walter Huffman is a 54 y.o. male.   Patient presents with 2 episodes of "dizziness" over the past 2 days.  Both occurred while he was in very hot environment; the first while he was grilling outside in the second while he was working.  He believes he got overheated.  He describes the episodes as "seeing spots" and feeling like he was going to faint.  He sat down and the episodes resolved with rest after 5-10 minutes.  He also reports an episode of feeling short of breath with chest tightness over the weekend.  He did not seek care at that time.  He denies current chest pain, shortness of breath, dizziness, headache, palpitations, numbness, weakness, or other symptoms.  The history is provided by the patient.    Past Medical History:  Diagnosis Date   Allergy    Bell's palsy    Bell's palsy    recurrent x 3 x over the last 20 years as of 08/05/2018    Chicken pox    HSV infection    2   Hypertension    Vitamin D deficiency     Patient Active Problem List   Diagnosis Date Noted   Gastroesophageal reflux disease without esophagitis 09/07/2019   Epididymal cyst 12/03/2018   Essential hypertension 09/04/2018   Hematuria 08/05/2018   Vitamin D deficiency 08/05/2018   HSV infection 08/05/2018   Bell's palsy 08/05/2018    Past Surgical History:  Procedure Laterality Date   COLONOSCOPY     DENTAL RESTORATION/EXTRACTION WITH X-RAY     had teeth removed   HERNIA REPAIR     I & D EXTREMITY Left 01/11/2016   Procedure: IRRIGATION AND DEBRIDEMENT OF WRIST;  Surgeon: Iran Planas, MD;  Location: [place];  Service: Orthopedics;  Laterality: Left;       Home Medications    Prior to Admission medications   Medication Sig Start Date End Date Taking? Authorizing Provider  Cholecalciferol 1.25 MG  (50000 UT) capsule Take 1 capsule (50,000 Units total) by mouth once a week. 09/13/19   McLean-Scocuzza, Nino Glow, MD  olmesartan-hydrochlorothiazide (BENICAR HCT) 20-12.5 MG tablet Take 1 tablet by mouth daily. In am 09/07/19   McLean-Scocuzza, Nino Glow, MD  pantoprazole (PROTONIX) 40 MG tablet Take 1 tablet (40 mg total) by mouth daily. In pm 30 minutes before dinner 09/07/19   McLean-Scocuzza, Nino Glow, MD  valACYclovir (VALTREX) 1000 MG tablet Take 1 tablet (1,000 mg total) by mouth 3 (three) times daily. X 7-10 days as needed Bells palsy or herpes 09/07/19   McLean-Scocuzza, Nino Glow, MD    Family History Family History  Problem Relation Age of Onset   Diabetes Mother    Hypertension Mother    Throat cancer Father    Throat cancer Paternal Uncle    Stomach cancer Neg Hx    Colon cancer Neg Hx    Pancreatic cancer Neg Hx     Social History Social History   Tobacco Use   Smoking status: Current Every Day Smoker    Packs/day: 1.00    Years: 20.00    Pack years: 20.00    Types: Cigarettes   Smokeless tobacco: Never Used  Vaping Use   Vaping Use: Never used  Substance Use Topics  Alcohol use: No   Drug use: No     Allergies   Patient has no known allergies.   Review of Systems Review of Systems  Constitutional: Negative for chills and fever.  HENT: Negative for ear pain and sore throat.   Eyes: Negative for pain and visual disturbance.  Respiratory: Positive for chest tightness and shortness of breath. Negative for cough.   Cardiovascular: Negative for chest pain and palpitations.  Gastrointestinal: Negative for abdominal pain and vomiting.  Genitourinary: Negative for dysuria and hematuria.  Musculoskeletal: Negative for arthralgias and back pain.  Skin: Negative for color change and rash.  Neurological: Positive for dizziness. Negative for seizures, syncope, facial asymmetry, speech difficulty, weakness, numbness and headaches.  All other systems reviewed and  are negative.    Physical Exam Triage Vital Signs ED Triage Vitals  Enc Vitals Group     BP      Pulse      Resp      Temp      Temp src      SpO2      Weight      Height      Head Circumference      Peak Flow      Pain Score      Pain Loc      Pain Edu?      Excl. in [place]?    Orthostatic VS for the past 24 hrs:  BP- Lying Pulse- Lying BP- Sitting Pulse- Sitting BP- Standing at 0 minutes Pulse- Standing at 0 minutes  02/22/20 1033 118/73 66 104/74 69 107/78 84    Updated Vital Signs BP 100/66 (BP Location: Right Arm)    Pulse 77    Temp 98 F (36.7 C) (Oral)    Resp 16    SpO2 96%   Visual Acuity Right Eye Distance:   Left Eye Distance:   Bilateral Distance:    Right Eye Near:   Left Eye Near:    Bilateral Near:     Physical Exam Vitals and nursing note reviewed.  Constitutional:      General: He is not in acute distress.    Appearance: He is well-developed. He is not ill-appearing.  HENT:     Head: Normocephalic and atraumatic.     Right Ear: Tympanic membrane normal.     Left Ear: Tympanic membrane normal.     Nose: Nose normal.     Mouth/Throat:     Mouth: Mucous membranes are moist.     Pharynx: Oropharynx is clear.  Eyes:     Extraocular Movements: Extraocular movements intact.     Conjunctiva/sclera: Conjunctivae normal.     Pupils: Pupils are equal, round, and reactive to light.  Cardiovascular:     Rate and Rhythm: Normal rate and regular rhythm.     Heart sounds: Normal heart sounds. No murmur heard.   Pulmonary:     Effort: Pulmonary effort is normal. No respiratory distress.     Breath sounds: Normal breath sounds.  Abdominal:     Palpations: Abdomen is soft.     Tenderness: There is no abdominal tenderness. There is no guarding or rebound.  Musculoskeletal:        General: Normal range of motion.     Cervical back: Neck supple.     Right lower leg: No edema.     Left lower leg: No edema.  Skin:    General: Skin is warm and dry.  Findings: No rash.  Neurological:     General: No focal deficit present.     Mental Status: He is alert and oriented to person, place, and time.     Cranial Nerves: No cranial nerve deficit.     Sensory: No sensory deficit.     Motor: No weakness.     Coordination: Coordination normal.     Gait: Gait normal.  Psychiatric:        Mood and Affect: Mood normal.        Behavior: Behavior normal.      UC Treatments / Results  Labs (all labs ordered are listed, but only abnormal results are displayed) Labs Reviewed  NOVEL CORONAVIRUS, NAA  CBC  COMPREHENSIVE METABOLIC PANEL  POCT URINALYSIS DIP (MANUAL ENTRY)    EKG   Radiology No results found.  Procedures Procedures (including critical care time)  Medications Ordered in UC Medications - No data to display  Initial Impression / Assessment and Plan / UC Course  I have reviewed the triage vital signs and the nursing notes.  Pertinent labs & imaging results that were available during my care of the patient were reviewed by me and considered in my medical decision making (see chart for details).   Dizziness, presyncopal episodes.  Discussed limitations of urgent care versus going to the emergency department.  Patient declines transfer to the ED at this time.  EKG shows sinus rhythm, rate 62, no ST elevation, compared to previous from 2018.  CBC, CMP pending.  Urine normal.  PCR COVID pending.  Patient instructed to self quarantine until the test result is back.  Instructed patient to call his PCP for the soonest available appointment for a follow-up.  Instructed him to go to the ED if he has acute worsening symptoms.  Patient agrees to plan of care.   Final Clinical Impressions(s) / UC Diagnoses   Final diagnoses:  Dizziness  Pre-syncope     Discharge Instructions     Go to the emergency department if you have acute worsening symptoms.    Follow-up with your primary care provider as soon as possible.    Your lab  work is pending and I will call you with the results tomorrow.  Your urine is normal.  Your EKG looks similar to the previous one from 2018.    Your Covid test is pending.  You should self quarantine until the test result is back.        ED Prescriptions    None     PDMP not reviewed this encounter.   Sharion Balloon, NP 02/22/20 1039

## 2020-02-23 ENCOUNTER — Telehealth (INDEPENDENT_AMBULATORY_CARE_PROVIDER_SITE_OTHER): Payer: BC Managed Care – PPO | Admitting: Internal Medicine

## 2020-02-23 ENCOUNTER — Encounter: Payer: Self-pay | Admitting: Internal Medicine

## 2020-02-23 ENCOUNTER — Telehealth: Payer: Self-pay | Admitting: Emergency Medicine

## 2020-02-23 VITALS — Ht 69.0 in | Wt 140.0 lb

## 2020-02-23 DIAGNOSIS — I959 Hypotension, unspecified: Secondary | ICD-10-CM

## 2020-02-23 DIAGNOSIS — Z1329 Encounter for screening for other suspected endocrine disorder: Secondary | ICD-10-CM

## 2020-02-23 DIAGNOSIS — I10 Essential (primary) hypertension: Secondary | ICD-10-CM

## 2020-02-23 DIAGNOSIS — Z125 Encounter for screening for malignant neoplasm of prostate: Secondary | ICD-10-CM

## 2020-02-23 DIAGNOSIS — R42 Dizziness and giddiness: Secondary | ICD-10-CM

## 2020-02-23 LAB — COMPREHENSIVE METABOLIC PANEL
ALT: 16 IU/L (ref 0–44)
AST: 18 IU/L (ref 0–40)
Albumin/Globulin Ratio: 1.3 (ref 1.2–2.2)
Albumin: 4.6 g/dL (ref 3.8–4.9)
Alkaline Phosphatase: 105 IU/L (ref 48–121)
BUN/Creatinine Ratio: 13 (ref 9–20)
BUN: 16 mg/dL (ref 6–24)
Bilirubin Total: 0.9 mg/dL (ref 0.0–1.2)
CO2: 22 mmol/L (ref 20–29)
Calcium: 10.3 mg/dL — ABNORMAL HIGH (ref 8.7–10.2)
Chloride: 100 mmol/L (ref 96–106)
Creatinine, Ser: 1.27 mg/dL (ref 0.76–1.27)
GFR calc Af Amer: 74 mL/min/{1.73_m2} (ref >59)
GFR calc non Af Amer: 64 mL/min/{1.73_m2} (ref >59)
Globulin, Total: 3.5 g/dL (ref 1.5–4.5)
Glucose: 83 mg/dL (ref 65–99)
Potassium: 4 mmol/L (ref 3.5–5.2)
Sodium: 137 mmol/L (ref 134–144)
Total Protein: 8.1 g/dL (ref 6.0–8.5)

## 2020-02-23 LAB — CBC
Hematocrit: 46 % (ref 37.5–51.0)
Hemoglobin: 16.1 g/dL (ref 13.0–17.7)
MCH: 32.5 pg (ref 26.6–33.0)
MCHC: 35 g/dL (ref 31.5–35.7)
MCV: 93 fL (ref 79–97)
Platelets: 263 10*3/uL (ref 150–450)
RBC: 4.96 x10E6/uL (ref 4.14–5.80)
RDW: 12.6 % (ref 11.6–15.4)
WBC: 9.2 10*3/uL (ref 3.4–10.8)

## 2020-02-23 MED ORDER — OLMESARTAN MEDOXOMIL-HCTZ 20-12.5 MG PO TABS: 0.5000 | ORAL_TABLET | Freq: Every day | ORAL | 3 refills | Status: AC

## 2020-02-23 NOTE — Progress Notes (Signed)
Virtual Visit via Video Note  I connected with Walter Huffman  on 02/23/20 at  1:30 PM EDT by a video enabled telemedicine application and verified that I am speaking with the correct person using two identifiers.  Location patient: home Location provider:work or home office Persons participating in the virtual visit: patient, provider, pts wife   I discussed the limitations of evaluation and management by telemedicine and the availability of in person appointments. The patient expressed understanding and agreed to proceed.   HPI: 1. 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 htn on benicar hct 20-12.5 mg qd bp running low likely reason for #1    ROS: See pertinent positives and negatives per HPI.  Past Medical History:  Diagnosis Date  . Allergy   . Bell's palsy   . Bell's palsy    recurrent x 3 x over the last 20 years as of 08/05/2018   . Chicken pox   . HSV infection    2  . Hypertension   . Vitamin D deficiency     Past Surgical History:  Procedure Laterality Date  . COLONOSCOPY    . DENTAL RESTORATION/EXTRACTION WITH X-RAY     had teeth removed  . HERNIA REPAIR    . I & D EXTREMITY Left 01/11/2016   Procedure: IRRIGATION AND DEBRIDEMENT OF WRIST;  Surgeon: Bradly Bienenstock, MD;  Location: [facility];  Service: Orthopedics;  Laterality: Left;    Family History  Problem Relation Age of Onset  . Diabetes Mother   . Hypertension Mother   . Throat cancer Father   . Throat cancer Paternal Uncle   . Stomach cancer Neg Hx   . Colon cancer Neg Hx   . Pancreatic cancer Neg Hx     SOCIAL HX: married   Current Outpatient Medications:  .  olmesartan-hydrochlorothiazide (BENICAR HCT)  20-12.5 MG tablet, Take 0.5 tablets by mouth daily. In am. Cut in 1/2 for patient please pharmacy, Disp: 45 tablet, Rfl: 3 .  Cholecalciferol 1.25 MG (50000 UT) capsule, Take 1 capsule (50,000 Units total) by mouth once a week. (Patient not taking: Reported on 02/23/2020), Disp: 13 capsule, Rfl: 1 .  pantoprazole (PROTONIX) 40 MG tablet, Take 1 tablet (40 mg total) by mouth daily. In pm 30 minutes before dinner (Patient not taking: Reported on 02/23/2020), Disp: 90 tablet, Rfl: 3 .  valACYclovir (VALTREX) 1000 MG tablet, Take 1 tablet (1,000 mg total) by mouth 3 (three) times daily. X 7-10 days as needed Bells palsy or herpes (Patient not taking: Reported on 02/23/2020), Disp: 90 tablet, Rfl: 3  EXAM:  VITALS per patient if applicable:  GENERAL: alert, oriented, appears well and in no acute distress  HEENT: atraumatic, conjunttiva clear, no obvious abnormalities on inspection of external nose and ears  NECK: normal movements of the head and neck  LUNGS: on inspection no signs of respiratory distress, breathing rate appears normal, no obvious gross SOB, gasping or wheezing  CV: no obvious cyanosis  MS: moves all visible extremities without noticeable abnormality  PSYCH/NEURO: pleasant and cooperative, no obvious depression or anxiety, speech and thought processing grossly intact  ASSESSMENT AND PLAN:  Discussed the following assessment and plan:  Dizziness Likely due to hypotension  Hydrate   Essential hypertension - Plan: olmesartan-hydrochlorothiazide (BENICAR HCT) 20-12.5 MG cut in 1/2 from 1 pill in the am   HM Never had flu shot  ? Date on Tdap  Disc shingirix in future  covid 19 vx 2/2  utd Consider pna 23 in future with h/o smoking  rec hep B vx in future and MMR vx HCV negative HIV negative 08/05/2018    Smoker 1 ppd since age 34 y.o no FH lung cancer, rec smoking cessation  PSAnormal 1/22/20201.03will need DRE in future Colonoscopy/EGD referred [facility] [place] h/o  ulcerations in mouth ? HSV + serology -colonoscopy 09/01/18 hyperplastic polypf/u in 10 years Of note hematuriaurology referral did not go in 08/2018 due to covid 19 see above  -we discussed possible serious and likely etiologies, options for evaluation and workup, limitations of telemedicine visit vs in person visit, treatment, treatment risks and precautions. Pt prefers to treat via telemedicine empirically rather then risking or undertaking an in person visit at this moment. Patient agrees to seek prompt in person care if worsening, new symptoms arise, or if is not improving with treatment.   I discussed the assessment and treatment plan with the patient. The patient was provided an opportunity to ask questions and all were answered. The patient agreed with the plan and demonstrated an understanding of the instructions.   The patient was advised to call back or seek an in-person evaluation if the symptoms worsen or if the condition fails to improve as anticipated.  Time spent 20 min Delorise Jackson, MD

## 2020-02-23 NOTE — Telephone Encounter (Signed)
Called patient and discussed lab results.  Instructed him to follow up with his PCP as scheduled or needed.  He agrees to plan of care.

## 2020-02-23 NOTE — Progress Notes (Signed)
Patient presenting with dizziness. Was seen in urgent care yesterday and they informed the Patient that he got too hot.

## 2020-02-24 LAB — NOVEL CORONAVIRUS, NAA: SARS-CoV-2, NAA: NOT DETECTED

## 2020-02-24 LAB — SARS-COV-2, NAA 2 DAY TAT

## 2020-03-22 ENCOUNTER — Ambulatory Visit: Payer: BC Managed Care – PPO | Admitting: Internal Medicine

## 2020-04-05 DIAGNOSIS — M7662 Achilles tendinitis, left leg: Secondary | ICD-10-CM | POA: Diagnosis not present

## 2020-07-24 IMAGING — MR MR BRAIN/IAC WO/W
10 of 14 series · 26 of 48 positions shown · IV contrast (gadavist)
Comparison: None.

CLINICAL DATA: Recurrent Bell's palsy, occurring 3 times in the
past 20 years with most recent episode 2 weeks ago. Symptoms
included right facial droop.

EXAM:
MRI HEAD WITHOUT AND WITH CONTRAST
TECHNIQUE: Multiplanar, multiecho pulse sequences of the brain and surrounding
structures were obtained without and with intravenous contrast.
CONTRAST:  6 mL Gadavist

[Series 5: T1 · sagittal · 5.0mm · 0.62mm/px · 1 of 25 slices shown (1 of 3)]
[im 1/25]
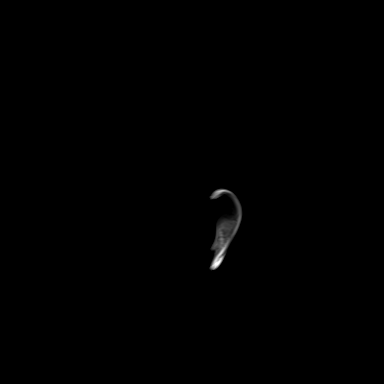

[Series 6: ax dwi_tracew · axial · 3.0mm · 0.73mm/px · z∈[-66,+89]mm · 4 of 53 slices shown]
[im 1/53]
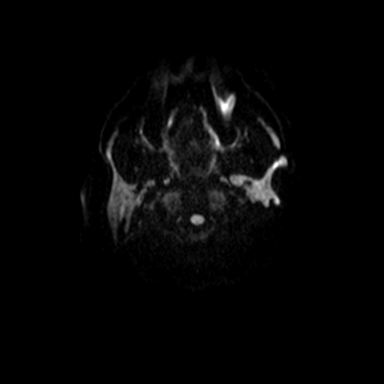
[im 18/53]
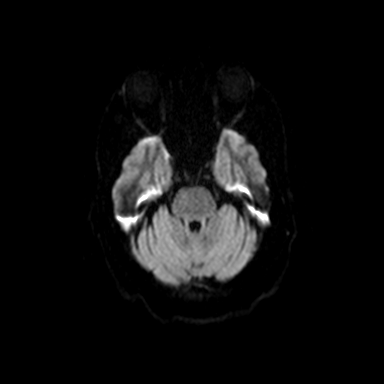
[im 35/53]
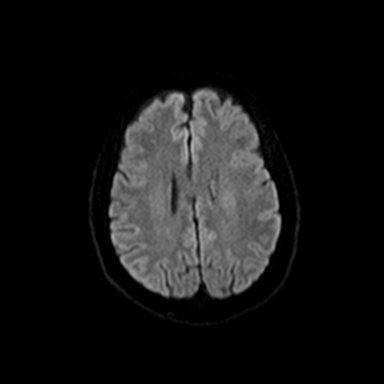
[im 53/53]
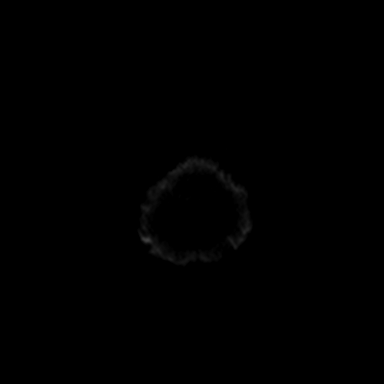

[Series 7: ax dwi_adc · axial · 3.0mm · 0.73mm/px · z∈[-66,-16]mm · 2 of 53 slices shown]
[im 1/53]
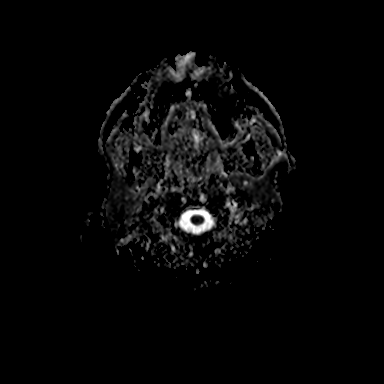
[im 18/53]
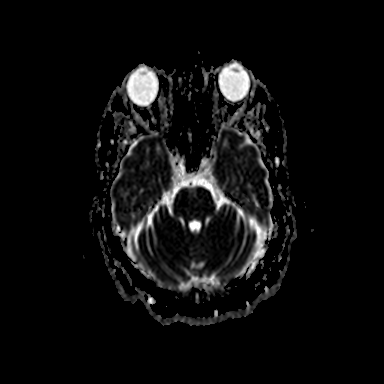

[Series 8: T2 · axial · 5.0mm · 0.53mm/px · z∈[-57,+86]mm · 2 of 25 slices shown]
[im 1/25]
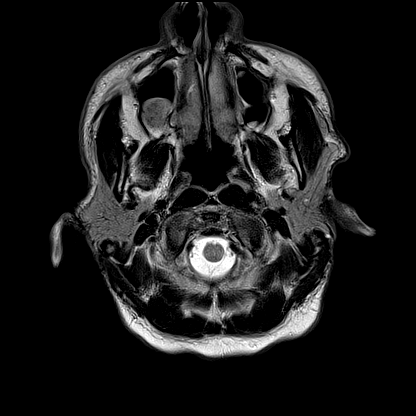
[im 25/25]
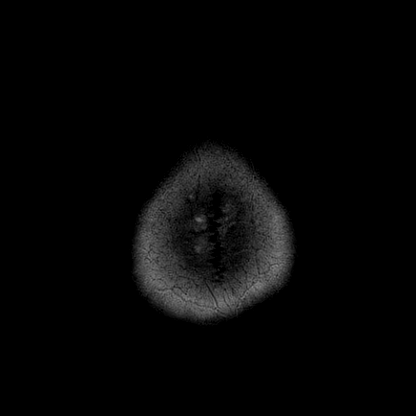

[Series 13: T1 · coronal · non-contrast · 3.0mm · 0.21mm/px · 1 of 12 slices shown (2 of 3)]
[im 1/12]
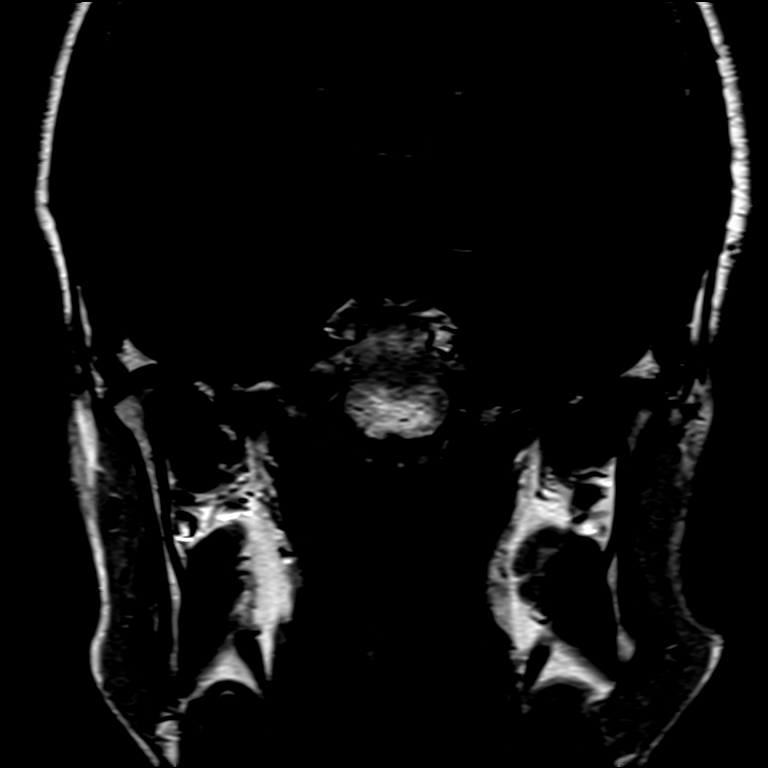

[Series 14: FLAIR · axial · 3.0mm · 0.53mm/px · z∈[-66,+95]mm · 4 of 55 slices shown]
[im 1/55]
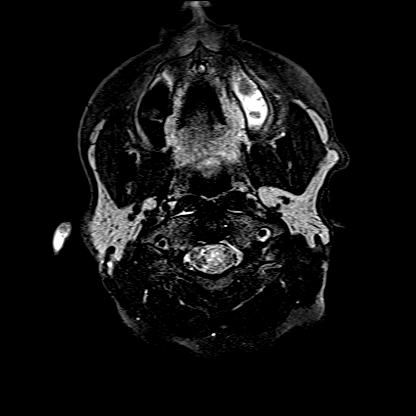
[im 19/55]
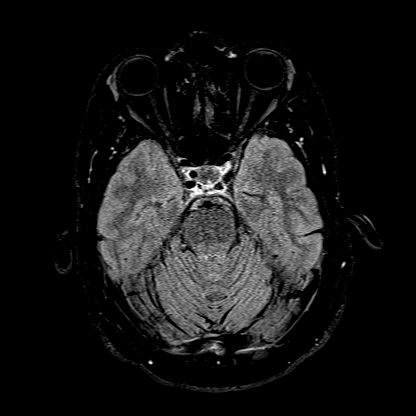
[im 37/55]
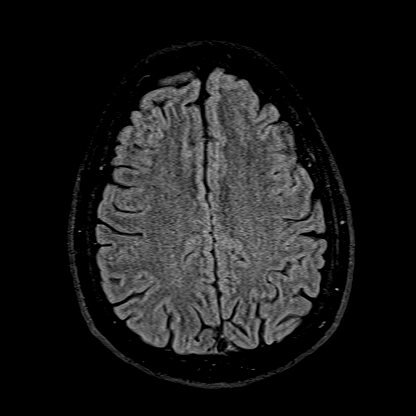
[im 55/55]
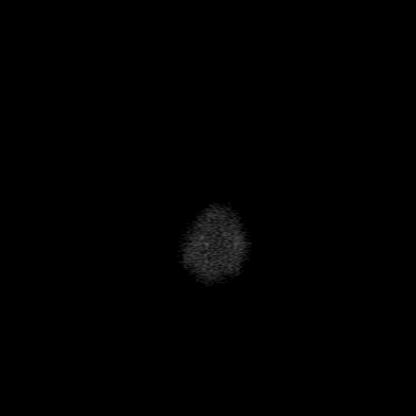

[Series 16: T1 · axial · non-contrast · 3.0mm · 0.21mm/px · 1 of 15 slices shown (3 of 3)]
[im 1/15]
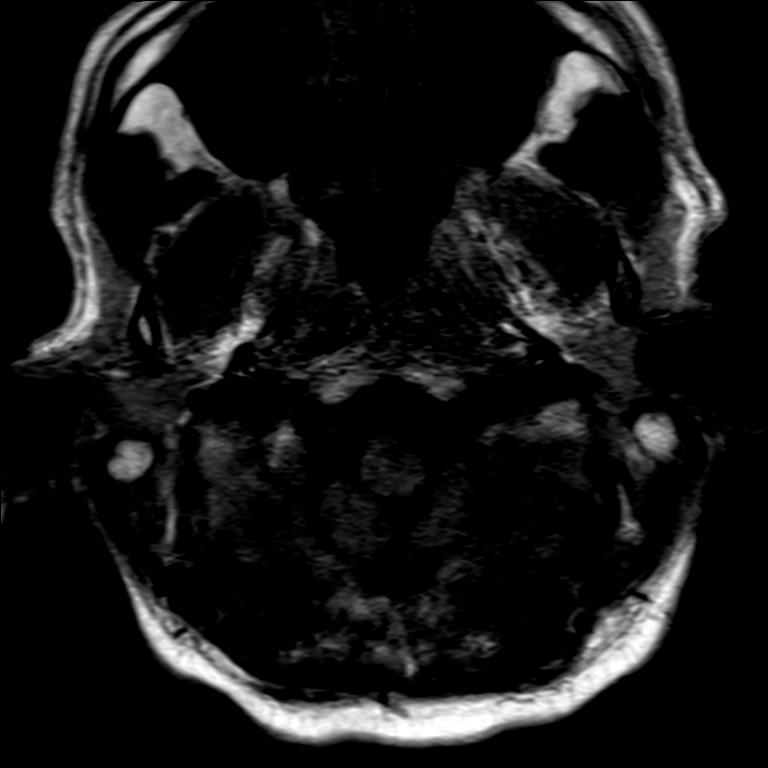

[Series 17: T1 post-contrast · axial · 3.0mm · 0.21mm/px · 1 of 15 slices shown (1 of 3)]
[im 1/15]
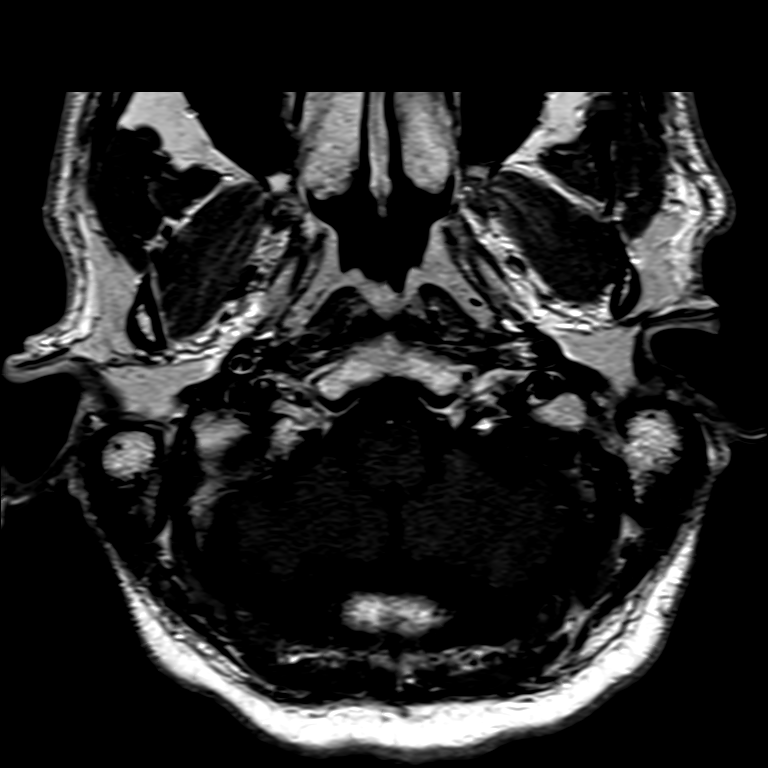

[Series 18: T1 post-contrast · coronal · 3.0mm · 0.21mm/px · 1 of 12 slices shown (2 of 3)]
[im 1/12]
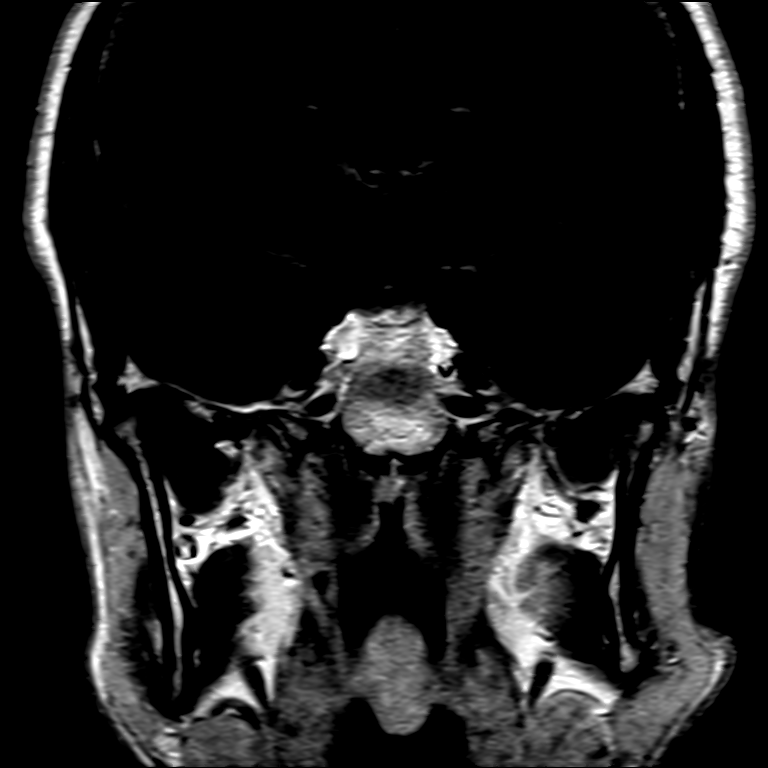

[Series 19: T1 post-contrast · axial · 1.0mm · 0.98mm/px · z∈[-73,+101]mm · 9 of 173 slices shown (3 of 3)]
[im 1/173]
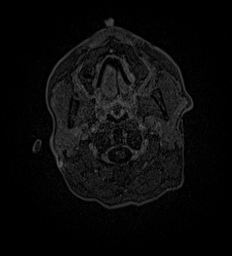
[im 32/173]
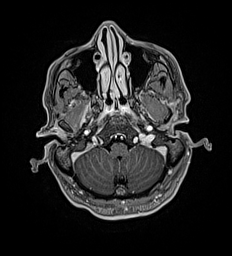
[im 47/173]
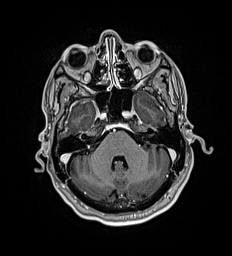
[im 79/173]
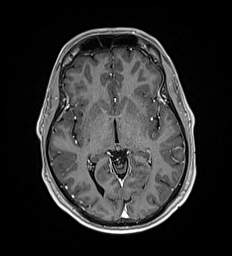
[im 94/173]
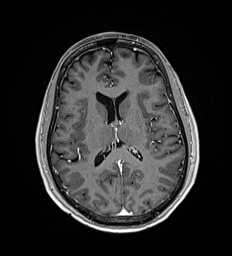
[im 126/173]
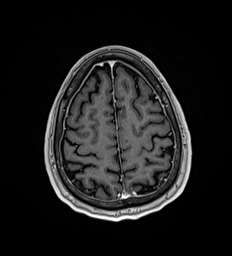
[im 141/173]
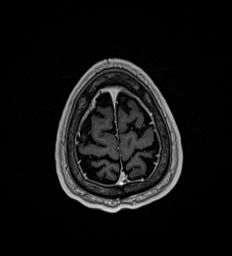
[im 157/173]
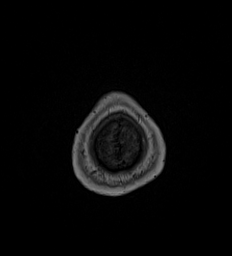
[im 173/173]
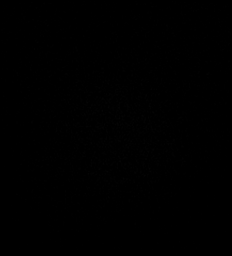

[26 of 48 positions shown; findings below may reference images not displayed]

FINDINGS: Brain: There is no evidence of acute infarct, intracranial
hemorrhage, mass, midline shift, or extra-axial fluid collection.
The ventricles and sulci are normal. Minimal cerebral white matter
T2 hyperintensities are most notable in the parietal lobes and
adjacent to the left frontal horn.

Dedicated internal auditory canal imaging demonstrates a normal
appearance of the seventh and eighth cranial nerve complex on the
left. There is linear, non-masslike enhancement along the right
facial nerve in the internal auditory canal and involving the
labyrinthine segment, most conspicuous on whole brain axial T1
postcontrast images (series 19, image 46). There is also asymmetric
right facial nerve enhancement from the geniculate ganglion through
the mastoid portion compared to the contralateral side. No parotid
or skull base mass is evident.

Vascular: Major intracranial vascular flow voids are preserved.

Skull and upper cervical spine: Unremarkable bone marrow signal.

Sinuses/Orbits: Right maxillary sinus mucous retention cyst. Clear
mastoid air cells.

Other: None.
IMPRESSION: 1. Asymmetric right facial nerve enhancement consistent with the
history of Bell's palsy. No mass.
2. Minimal chronic small vessel ischemic changes in the cerebral
white matter.

## 2020-09-19 ENCOUNTER — Other Ambulatory Visit: Payer: BC Managed Care – PPO

## 2020-09-21 ENCOUNTER — Ambulatory Visit: Payer: BC Managed Care – PPO | Admitting: Internal Medicine

## 2021-03-04 ENCOUNTER — Other Ambulatory Visit: Payer: Self-pay | Admitting: Internal Medicine

## 2021-03-04 DIAGNOSIS — I10 Essential (primary) hypertension: Secondary | ICD-10-CM

## 2021-09-14 ENCOUNTER — Other Ambulatory Visit: Payer: Self-pay | Admitting: Family Medicine

## 2021-09-14 DIAGNOSIS — N50819 Testicular pain, unspecified: Secondary | ICD-10-CM

## 2021-09-14 DIAGNOSIS — R1030 Lower abdominal pain, unspecified: Secondary | ICD-10-CM

## 2021-09-21 ENCOUNTER — Ambulatory Visit: Payer: BC Managed Care – PPO

## 2021-09-26 ENCOUNTER — Ambulatory Visit: Payer: Self-pay | Admitting: Urology

## 2022-07-22 ENCOUNTER — Ambulatory Visit
Admission: EM | Admit: 2022-07-22 | Discharge: 2022-07-22 | Disposition: A | Discharge status: Home / Self Care | Payer: BC Managed Care – PPO | Attending: Emergency Medicine | Admitting: Emergency Medicine

## 2022-07-22 DIAGNOSIS — R369 Urethral discharge, unspecified: Secondary | ICD-10-CM | POA: Insufficient documentation

## 2022-07-22 LAB — POCT URINALYSIS DIP (MANUAL ENTRY)
Bilirubin, UA: NEGATIVE
Glucose, UA: NEGATIVE mg/dL
Ketones, POC UA: NEGATIVE mg/dL
Leukocytes, UA: NEGATIVE
Nitrite, UA: NEGATIVE
Protein Ur, POC: NEGATIVE mg/dL
Spec Grav, UA: >=1.03 — AB (ref 1.010–1.025)
Urobilinogen, UA: 0.2 E.U./dL
pH, UA: 5.5 (ref 5.0–8.0)

## 2022-07-22 NOTE — Discharge Instructions (Signed)
Your tests are pending.  If your test results are positive, we will call you.  You and your sexual partner(s) may require treatment at that time.  Do not have sexual activity for at least 7 days.    Follow up with your primary care provider if your symptoms are not improving.    

## 2022-07-22 NOTE — ED Triage Notes (Signed)
Patient to Urgent Care with complaints of lower back pain, right groin pain, and penile discharge.  Symptoms started around christmas. Denies any known fevers.   Denies any dysuria. Reports he notices some penile discharge at the end of urination.

## 2022-07-22 NOTE — ED Provider Notes (Signed)
Walter Huffman    CSN: [id number] Arrival date & time: 07/22/22  1535      History   Chief Complaint Chief Complaint  Patient presents with   Back Pain    HPI [name] is a 57 y.o. male.  Patient presents with 2 week history of occasional penile discharge, groin pain, low back pain.  He reports low concern for STD as he is in a monogamous relationship.  Patient reports no dysuria.  He denies fever, rash, abdominal pain, hematuria, testicular pain, or other symptoms.  No treatment at home.  His medical history includes HSV, hypertension, GERD.  The history is provided by the patient and medical records.    Past Medical History:  Diagnosis Date   Allergy    Bell's palsy    Bell's palsy    recurrent x 3 x over the last 20 years as of 08/05/2018    Chicken pox    HSV infection    2   Hypertension    Vitamin D deficiency     Patient Active Problem List   Diagnosis Date Noted   Gastroesophageal reflux disease without esophagitis 09/07/2019   Epididymal cyst 12/03/2018   Essential hypertension 09/04/2018   Vitamin D deficiency 08/05/2018   HSV infection 08/05/2018   Bell's palsy 08/05/2018    Past Surgical History:  Procedure Laterality Date   COLONOSCOPY     DENTAL RESTORATION/EXTRACTION WITH X-RAY     had teeth removed   HERNIA REPAIR     I & D EXTREMITY Left 01/11/2016   Procedure: IRRIGATION AND DEBRIDEMENT OF WRIST;  Surgeon: Iran Planas, MD;  Location: [facility];  Service: Orthopedics;  Laterality: Left;       Home Medications    Prior to Admission medications   Medication Sig Start Date End Date Taking? Authorizing Provider  Cholecalciferol 1.25 MG (50000 UT) capsule Take 1 capsule (50,000 Units total) by mouth once a week. Patient not taking: Reported on 02/23/2020 09/13/19   McLean-Scocuzza, Nino Glow, MD  olmesartan-hydrochlorothiazide (BENICAR HCT) 20-12.5 MG tablet Take 0.5 tablets by mouth daily. In am. Cut in 1/2 for patient please pharmacy  02/23/20   McLean-Scocuzza, Nino Glow, MD  pantoprazole (PROTONIX) 40 MG tablet Take 1 tablet (40 mg total) by mouth daily. In pm 30 minutes before dinner Patient not taking: Reported on 02/23/2020 09/07/19   McLean-Scocuzza, Nino Glow, MD  valACYclovir (VALTREX) 1000 MG tablet Take 1 tablet (1,000 mg total) by mouth 3 (three) times daily. X 7-10 days as needed Bells palsy or herpes Patient not taking: Reported on 02/23/2020 09/07/19   McLean-Scocuzza, Nino Glow, MD    Family History Family History  Problem Relation Age of Onset   Diabetes Mother    Hypertension Mother    Throat cancer Father    Throat cancer Paternal Uncle    Stomach cancer Neg Hx    Colon cancer Neg Hx    Pancreatic cancer Neg Hx     Social History Social History   Tobacco Use   Smoking status: Every Day    Packs/day: 1.00    Years: 20.00    Total pack years: 20.00    Types: Cigarettes   Smokeless tobacco: Never  Vaping Use   Vaping Use: Never used  Substance Use Topics   Alcohol use: No   Drug use: No     Allergies   Patient has no known allergies.   Review of Systems Review of Systems  Constitutional:  Negative  for chills and fever.  Gastrointestinal:  Negative for abdominal pain, constipation, diarrhea, nausea and vomiting.  Genitourinary:  Positive for penile discharge. Negative for dysuria, flank pain, hematuria and testicular pain.  Musculoskeletal:  Positive for back pain.  Skin:  Negative for rash and wound.  All other systems reviewed and are negative.    Physical Exam Triage Vital Signs ED Triage Vitals  Enc Vitals Group     BP      Pulse      Resp      Temp      Temp src      SpO2      Weight      Height      Head Circumference      Peak Flow      Pain Score      Pain Loc      Pain Edu?      Excl. in [place]?    No data found.  Updated Vital Signs BP (!) 146/90   Pulse 70   Temp 98 F (36.7 C)   Resp 18   Ht '5\' 9"'$  (1.753 m)   Wt 135 lb (61.2 kg)   SpO2 98%   BMI 19.94  kg/m   Visual Acuity Right Eye Distance:   Left Eye Distance:   Bilateral Distance:    Right Eye Near:   Left Eye Near:    Bilateral Near:     Physical Exam Vitals and nursing note reviewed. Exam conducted with a chaperone present Joellen Jersey, Therapist, sports).  Constitutional:      General: He is not in acute distress.    Appearance: Normal appearance. He is well-developed. He is not ill-appearing.  HENT:     Mouth/Throat:     Mouth: Mucous membranes are moist.  Cardiovascular:     Rate and Rhythm: Normal rate and regular rhythm.     Heart sounds: Normal heart sounds.  Pulmonary:     Effort: Pulmonary effort is normal. No respiratory distress.     Breath sounds: Normal breath sounds.  Abdominal:     General: Bowel sounds are normal.     Palpations: Abdomen is soft.     Tenderness: There is no abdominal tenderness. There is no right CVA tenderness, left CVA tenderness, guarding or rebound.  Genitourinary:    Penis: Normal. No discharge.      Testes: Normal.        Right: Tenderness not present.        Left: Tenderness not present.     Epididymis:     Right: Normal.     Left: Normal.  Musculoskeletal:        General: No swelling.     Cervical back: Neck supple.  Skin:    General: Skin is warm and dry.  Neurological:     Mental Status: He is alert.  Psychiatric:        Mood and Affect: Mood normal.        Behavior: Behavior normal.      UC Treatments / Results  Labs (all labs ordered are listed, but only abnormal results are displayed) Labs Reviewed  POCT URINALYSIS DIP (MANUAL ENTRY) - Abnormal; Notable for the following components:      Result Value   Spec Grav, UA >=1.030 (*)    Blood, UA small (*)    All other components within normal limits  CYTOLOGY, (ORAL, ANAL, URETHRAL) ANCILLARY ONLY    EKG   Radiology No results found.  Procedures Procedures (including critical care time)  Medications Ordered in UC Medications - No data to display  Initial Impression  / Assessment and Plan / UC Course  I have reviewed the triage vital signs and the nursing notes.  Pertinent labs & imaging results that were available during my care of the patient were reviewed by me and considered in my medical decision making (see chart for details).    Penile discharge.  Urine does not indicate infection.  No penile discharge or testicular pain on exam.  Penile swab pending.  Discussed that we will call if test results are positive.  Discussed that he may require treatment at that time.  Discussed that sexual partner(s) may also require treatment.  Instructed patient to abstain from sexual activity for at least 7 days.  Instructed him to follow-up with his PCP if his symptoms are not improving.  Patient agrees to plan of care.   Final Clinical Impressions(s) / UC Diagnoses   Final diagnoses:  Penile discharge     Discharge Instructions      Your tests are pending.  If your test results are positive, we will call you.  You and your sexual partner(s) may require treatment at that time.  Do not have sexual activity for at least 7 days.    Follow up with your primary care provider if your symptoms are not improving.          ED Prescriptions   None    I have reviewed the PDMP during this encounter.   Sharion Balloon, NP 07/22/22 [phone]

## 2022-07-23 LAB — CYTOLOGY, (ORAL, ANAL, URETHRAL) ANCILLARY ONLY
Chlamydia: NEGATIVE
Comment: NEGATIVE
Comment: NEGATIVE
Comment: NORMAL
Neisseria Gonorrhea: NEGATIVE
Trichomonas: NEGATIVE
